# Patient Record
Sex: Female | Born: 1935 | Race: Black or African American | Hispanic: No | Smoking: Never smoker
Health system: Southern US, Community
[De-identification: ages and names within clinical notes are randomized; demographics above are authoritative.]

## PROBLEM LIST (undated history)

## (undated) DIAGNOSIS — I10 Essential (primary) hypertension: Secondary | ICD-10-CM

## (undated) DIAGNOSIS — N289 Disorder of kidney and ureter, unspecified: Secondary | ICD-10-CM

## (undated) HISTORY — PX: ABDOMINAL HYSTERECTOMY: SHX81

## (undated) HISTORY — PX: CHOLECYSTECTOMY: SHX55

---

## 2007-03-28 ENCOUNTER — Ambulatory Visit: Payer: Self-pay | Admitting: Otolaryngology

## 2007-03-31 ENCOUNTER — Ambulatory Visit: Payer: Self-pay | Admitting: Family Medicine

## 2007-04-05 ENCOUNTER — Ambulatory Visit: Payer: Self-pay | Admitting: Otolaryngology

## 2009-05-15 ENCOUNTER — Ambulatory Visit: Payer: Self-pay | Admitting: Family Medicine

## 2011-11-05 ENCOUNTER — Inpatient Hospital Stay: Payer: Self-pay | Admitting: Internal Medicine

## 2011-11-05 LAB — COMPREHENSIVE METABOLIC PANEL
Alkaline Phosphatase: 111 U/L (ref 50–136)
Calcium, Total: 9 mg/dL (ref 8.5–10.1)
Co2: 26 mmol/L (ref 21–32)
EGFR (African American): 33 — ABNORMAL LOW
EGFR (Non-African Amer.): 29 — ABNORMAL LOW
Osmolality: 287 (ref 275–301)
SGPT (ALT): 29 U/L

## 2011-11-05 LAB — URINALYSIS, COMPLETE
Bilirubin,UR: NEGATIVE
Glucose,UR: NEGATIVE mg/dL (ref 0–75)
Ketone: NEGATIVE
Leukocyte Esterase: NEGATIVE
RBC,UR: 2 /HPF (ref 0–5)
Squamous Epithelial: 5

## 2011-11-05 LAB — CBC
HCT: 44.3 % (ref 35.0–47.0)
Platelet: 285 10*3/uL (ref 150–440)
RDW: 14.2 % (ref 11.5–14.5)
WBC: 7.5 10*3/uL (ref 3.6–11.0)

## 2011-11-05 LAB — CK TOTAL AND CKMB (NOT AT ARMC): CK, Total: 134 U/L (ref 21–215)

## 2011-11-05 LAB — MAGNESIUM: Magnesium: 1.7 mg/dL — ABNORMAL LOW

## 2011-11-05 LAB — TSH: Thyroid Stimulating Horm: 1.38 u[IU]/mL

## 2011-11-05 LAB — PHOSPHORUS: Phosphorus: 4.2 mg/dL (ref 2.5–4.9)

## 2011-11-06 LAB — TROPONIN I
Troponin-I: 0.37 ng/mL — ABNORMAL HIGH
Troponin-I: 0.34 ng/mL — ABNORMAL HIGH

## 2011-11-06 LAB — BASIC METABOLIC PANEL WITH GFR
Anion Gap: 7
BUN: 32 mg/dL — ABNORMAL HIGH
Calcium, Total: 8.4 mg/dL — ABNORMAL LOW
Chloride: 107 mmol/L
Co2: 24 mmol/L
Creatinine: 1.48 mg/dL — ABNORMAL HIGH
EGFR (African American): 38 — ABNORMAL LOW
EGFR (Non-African Amer.): 33 — ABNORMAL LOW
Glucose: 93 mg/dL
Osmolality: 282
Potassium: 4.4 mmol/L
Sodium: 138 mmol/L

## 2011-11-06 LAB — CBC WITH DIFFERENTIAL/PLATELET
Basophil #: 0 x10 3/mm 3
Basophil %: 0.5 %
Eosinophil #: 0 x10 3/mm 3
Eosinophil %: 0.8 %
HCT: 37.7 %
HGB: 13 g/dL
Lymphocyte %: 19.6 %
Lymphs Abs: 1.2 x10 3/mm 3
MCH: 29.9 pg
MCHC: 34.6 g/dL
MCV: 87 fL
Monocyte #: 0.5 "x10 3/mm "
Monocyte %: 7.4 %
Neutrophil #: 4.4 x10 3/mm 3
Neutrophil %: 71.7 %
Platelet: 235 x10 3/mm 3
RBC: 4.35 X10 6/mm 3
RDW: 14.3 %
WBC: 6.2 x10 3/mm 3

## 2011-11-06 LAB — LIPID PANEL
Cholesterol: 231 mg/dL — ABNORMAL HIGH
HDL Cholesterol: 61 mg/dL — ABNORMAL HIGH
Ldl Cholesterol, Calc: 156 mg/dL — ABNORMAL HIGH
Triglycerides: 70 mg/dL
VLDL Cholesterol, Calc: 14 mg/dL

## 2011-11-06 LAB — CK TOTAL AND CKMB (NOT AT ARMC)
CK, Total: 124 U/L
CK, Total: 155 U/L
CK-MB: 1.4 ng/mL
CK-MB: 1.6 ng/mL

## 2011-11-06 LAB — MAGNESIUM: Magnesium: 2.3 mg/dL

## 2011-11-06 LAB — HEMOGLOBIN A1C: Hemoglobin A1C: 5.7 %

## 2011-11-07 LAB — BASIC METABOLIC PANEL
Anion Gap: 10 (ref 7–16)
BUN: 31 mg/dL — ABNORMAL HIGH (ref 7–18)
Calcium, Total: 9 mg/dL (ref 8.5–10.1)
Co2: 24 mmol/L (ref 21–32)
EGFR (African American): 36 — ABNORMAL LOW
EGFR (Non-African Amer.): 31 — ABNORMAL LOW
Glucose: 104 mg/dL — ABNORMAL HIGH (ref 65–99)
Osmolality: 288 (ref 275–301)
Potassium: 4.2 mmol/L (ref 3.5–5.1)

## 2012-01-25 ENCOUNTER — Ambulatory Visit: Payer: Self-pay | Admitting: Family Medicine

## 2012-02-02 ENCOUNTER — Ambulatory Visit: Payer: Self-pay | Admitting: Family Medicine

## 2014-07-31 NOTE — Discharge Summary (Signed)
PATIENT NAME:  Michelle Carney, Michelle Carney MR#:  [id number] DATE OF BIRTH:  11/07/1929  DATE OF ADMISSION:  11/05/2011 DATE OF DISCHARGE:  11/07/2011  DISCHARGE DIAGNOSES:  1. Syncope secondary to dehydration. 2. Acute on chronic renal failure. 3. Dementia. 4. Urinary tract infection. 5. Hypertension. 6. Hyperlipidemia.   DISCHARGE MEDICATIONS: (New medicines) 1. Amlodipine 5 mg is added for her blood pressure.  2. For urinary tract infection, Cipro 500 mg b.i.d. for five days.  3. Xanax 0.5 mg ,1 to 2 times a day was prescribed according to family request for anxiety.  4. Aricept 5 mg daily.   Home medications which are not changed:  1. Pravastatin 40 mg daily.  2. Ranitidine 150 mg daily.   NOTE: The patient was on losartan, which was stopped, because of acute renal insufficiency.   CONSULTATIONS: Physical Therapy consult.    LABORATORY, DIAGNOSTIC AND RADIOLOGICAL DATA: Ventricular EKG: Sinus rhythm with some PVCs, 81 beats per minute. TSH 1.38. Magnesium slightly low at 1.7. Urinalysis showed yellow cloudy urine with 2+ bacteria. Troponin was slightly elevated at 0.36, 0.37, with normal CK and CPK-MB. A CT of the head was done because of syncope which showed atrophy with chronic vessel ischemic changes, left posterior parietal or bilateral occipital small infarct, but MRA of the brain showed no acute infarct, no hemorrhage, no pathological fluid collection. The patient's findings on the CT: Sequela of prior insult and also showed  cerebral atrophy. The patient's echocardiogram showed ejection fraction more than 55% with normal LV function. The patient's BUN and creatinine on admission were slightly low at BUN of 30, creatinine 1.67. At the time of discharge, BUN 31, creatinine better at 1.57, and GFR 36. Hemoglobin A1c 5.7 at admission but improved to 2.3. Troponin 0.34 but no EKG changes. Echocardiogram was normal. No chest pain.   HOSPITAL COURSE: The patient is an 81 year old female with  history of hypertension, hyperlipidemia, and dementia, brought in because the patient had syncope. Look at the History and Physical for full details.   1. Syncope: The patient did not have any seizure activity. Blood pressure was 176/120 when can she came. She was admitted for possible cerebrovascular accident. CT head showed possible stroke, but MRI of the brain did not show any acute changes. The patient's carotid ultrasound also did not show any acute hemodynamically significant stenosis. Echocardiogram: Normal systolic function. The patient initially was started on Aggrenox, but it was not continued because stroke was ruled out.  2. Acute on chronic renal insufficiency: The patient did have some renal insufficiency to begin with, and BUN was 13, creatinine 1.67 on admission; so she did receive fluids, and at the time of discharge creatinine was 1.57 and BUN 31. The patient had no trouble urinating, and the family mentioned that she did have some chronic renal insufficiency; so I stopped the losartan and continued on Norvasc and she is to follow with her primary doctor, Dr. Delight Stare.  3. Hypertension: Blood pressure was 153/84, and her losartan was stopped and she was started on beta blocker, but blood pressure dropped to 90/44 yesterday morning and heart rate 61; so I decided to start her on Norvasc and blood pressure returned back to 115/61 at the time of discharge.  4. Dementia and forgetfulness: The patient did get quite agitated on the 26th night and did receive some Geodon and was not oriented. According to the family, she has been getting forgetful lately; so they requested some Xanax andAricept prescriptions, so we filled  that.   5. Hyperlipidemia: Continued on pravastatin. LDL showed 156, and HDL 61, and cholesterol 231. There was some noncompliance issues so I did not increase the dose of statins.  6. Urinary tract infection: Also she has a urinary tract infection. Urine cultures are not  resulted yet. I will discharge her with Cipro.  7. Disposition: The patient was seen by Physical Therapy and discharged home with home assistance on [place].  according to PT recommendation  I discussed the plan with the patient's son and daughter-in-law multiple times.       TIME SPENT ON DISCHARGE PREPARATION: More than 30 minutes.  ____________________________ Epifanio Lesches, MD sk:cbb D: 11/08/2011 08:02:58 ET T: 11/08/2011 16:04:16 ET JOB#: [id number]  cc: Epifanio Lesches, MD, <Dictator> Marguerita Merles, MD Epifanio Lesches MD ELECTRONICALLY SIGNED 11/18/2011 14:07

## 2014-07-31 NOTE — H&P (Signed)
PATIENT NAME:  Michelle Carney, Michelle Carney MR#:  [id number] DATE OF BIRTH:  [birth year]  DATE OF ADMISSION:  11/05/2011  REFERRING PHYSICIAN: Dr. Michel Santee   PRIMARY CARE PHYSICIAN: Delight Stare, MD   CHIEF COMPLAINT: Syncopal episode.   HISTORY OF PRESENT ILLNESS: The patient is an 81 year old African American female with history of hypertension and hyperlipidemia who was brought in by family. The patient is not a particularly good historian. Per son and her paperwork whom I consulted to get information, apparently the patient was cooking breakfast this morning, felt weak and went to sit on the chair. After she sat she had about a 10 second period of flailing her arms and arching her back backwards. The patient passed out. No fall. The patient upon waking up was not postictal and had no confusion and was back to her baseline. The patient did have a bout of vomiting, however, was smallish per son. No shaking of the lower extremities. There was no incontinence. There was no tongue biting. The patient has had no history of syncope in the past. On arrival here she was noted to have creatinine 1.67. It is unclear if it's new or chronic. Furthermore, she had a positive troponin of 0.36 and a CT suggesting a possible new CVA. The patient was requested to be admitted for further evaluation and management by the hospitalist service.   PAST MEDICAL HISTORY:  1. Hypertension. 2. Hyperlipidemia. 3. Per her son forgetfulness without any diagnosis of dementia.   MEDICATIONS: 1. Losartan 50 mg daily. 2. Pravastatin 40 mg qd. 3. Ranitidine 150 mg p.o. daily p.r.n.   PAST SURGICAL HISTORY:  1. Hysterectomy. 2. Gallbladder surgery.   FAMILY HISTORY: Hypertension in mom, gallbladder disease.   SOCIAL HISTORY: No tobacco, alcohol, or drug use. Her son lives with her.   ALLERGIES: No known drug allergies.   REVIEW OF SYSTEMS: CONSTITUTIONAL: No fever. Fatigue for the past one month. Generalized weakness. No focal weakness.  Positive for weight loss. EYES: The patient states she has dizziness in her eyes. No redness. ENT: No tinnitus or hearing loss. The patient has had no sense of taste or smell for the past year or so. RESPIRATORY: No cough, wheezing, shortness of breath. No COPD. CARDIOVASCULAR: No chest pain. No orthopnea. No history of arrhythmia. No dyspnea on exertion. No palpitations. No history of syncope in the past. History of high blood pressure. GI: No nausea or vomiting. Currently had episode of nausea and vomiting this morning. Per her family she has had nausea and vomiting off and on for the past month, up to a couple of times in the past month or so. No abdominal pain. No melena or bloody stools. No bright red blood per rectum. GU: Denies dysuria. HEME/LYMPH: Denies anemia or easy bruising. SKIN: No new rashes. MUSCULOSKELETAL: Denies arthritis. NEUROLOGICAL: Possible dementia, no diagnosis however. PSYCHIATRIC: Denies anxiety or insomnia.   PHYSICAL EXAMINATION:   VITAL SIGNS: Temperature on arrival 99, pulse on arrival 88, respiratory rate 18, blood pressure 108/59, last one was 176/120, pulse oximetry 97% on room air.   GENERAL: The patient is a thin African American female laying in bed in no obvious distress talking in full sentences.   HEENT: Normocephalic, atraumatic. Pupils are equal, round, and reactive. Extraocular muscles intact. Anicteric sclerae. Moist mucous membranes. Poor dentition.    NECK: Supple. No thyroid tenderness. No JVD.   CARDIOVASCULAR: S1, S2. Possible systolic murmur around the aortic region.   LUNGS: Clear to auscultation without wheezing or rhonchi.  ABDOMEN: Soft, nontender, nondistended. Positive bowel sounds in all quadrants.   EXTREMITIES: No significant lower extremity edema.   NEUROLOGIC: Cranial nerves appear to be grossly intact II to XII. Strength is 5 out of 5 in all extremities. Sensation intact to light touch. Rapid alternating movements are intact.  Finger-to-nose intact.   PSYCHIATRIC: The patient is awake, alert, oriented x2, cooperative, pleasant.   LABORATORY, DIAGNOSTIC, AND RADIOLOGICAL DATA: Creatinine 1.67, BUN 30, sodium 141, potassium 4.6, CO2 26, GFR 33. Magnesium 1.7. LFTs within normal limits. Troponin 0.36. CK total 134. CK-MB 1.1. TSH 1.38. CBC within normal limits. Urinalysis not suggestive of infection.   EKG sinus rhythm with PVCs, biatrial enlargement, incomplete right bundle, no acute ST elevations or depressions, Q waves in III.  CAT scan of the head without contrast showing atrophy with chronic small vessel ischemic disease. Left posterior parietal to parietal occipital small infarct.   ASSESSMENT AND PLAN: We have an 81 year old female with history of hypertension, hyperlipidemia, and possible undiagnosed dementia with a syncopal episode lasting about 10 seconds without any postictal state, possible CVA on CAT scan which likely might be new onset. At this point we will admit the patient to telemetry. Would start CVA pathway allowing permissive hypertension and checking MRI of the brain. Neurologically the patient appears to be intact. Would also check an echocardiogram and a carotid ultrasound to look for any significant blockages. Would add p.r.n. labetalol if systolic pressures are significantly elevated. I would start the patient on Aggrenox and resume her statin. I will also obtain a PT consult. It is unclear if the patient had a seizure but it's unlikely given the short duration without any postictal state, however I would go ahead and check an EEG. Would also do frequent neuro checks and check for any arrhythmias by placing her on the monitor. The patient does have renal failure but I do not know if this is acute or chronic. I would hold her losartan. I would start her on gentle IV fluids as well. I would monitor her kidney function and consider doing an ultrasound of the kidneys if it does not improve. I would check a  cholesterol panel as well for her hyperlipidemia. The patient does have positive troponin, however, has no chest pains or complaints of chest pain. There is no acute ST elevations or depressions on EKG either. I would cycle troponins. Aspirin has already been given. Continue her lipids. I would start her on nitroglycerin p.r.n. just in case she will have any further chest pains and given echocardiogram. It is also possible that elevated troponin is from decreased renal disease. It is also possible from CVA if patient has it on the MRI. Her magnesium will be repleted as well. I would also start her on some Zofran p.r.n. for her nausea.   TOTAL TIME SPENT: 50 minutes.   CODE STATUS: The patient is FULL CODE.   ____________________________ Vivien Presto, MD sa:drc D: 11/05/2011 19:22:42 ET T: 11/06/2011 06:40:39 ET JOB#: [id number]  cc: Vivien Presto, MD, <Dictator> Marguerita Merles, MD Vivien Presto MD ELECTRONICALLY SIGNED 11/24/2011 15:16

## 2015-01-09 ENCOUNTER — Encounter: Payer: Self-pay | Admitting: Emergency Medicine

## 2015-01-09 ENCOUNTER — Emergency Department
Admission: EM | Admit: 2015-01-09 | Discharge: 2015-01-09 | Discharge status: Against Medical Advice | Payer: Medicare HMO | Attending: Emergency Medicine | Admitting: Emergency Medicine

## 2015-01-09 DIAGNOSIS — R1110 Vomiting, unspecified: Secondary | ICD-10-CM | POA: Insufficient documentation

## 2015-01-09 DIAGNOSIS — I10 Essential (primary) hypertension: Secondary | ICD-10-CM | POA: Diagnosis not present

## 2015-01-09 HISTORY — DX: Essential (primary) hypertension: I10

## 2015-01-09 HISTORY — DX: Disorder of kidney and ureter, unspecified: N28.9

## 2015-01-09 LAB — CBC
HEMATOCRIT: 40.4 % (ref 35.0–47.0)
Hemoglobin: 13.5 g/dL (ref 12.0–16.0)
MCH: 29 pg (ref 26.0–34.0)
MCHC: 33.5 g/dL (ref 32.0–36.0)
MCV: 86.5 fL (ref 80.0–100.0)
Platelets: 281 10*3/uL (ref 150–440)
RBC: 4.67 MIL/uL (ref 3.80–5.20)
RDW: 13.4 % (ref 11.5–14.5)
WBC: 11.1 10*3/uL — ABNORMAL HIGH (ref 3.6–11.0)

## 2015-01-09 LAB — URINALYSIS COMPLETE WITH MICROSCOPIC (ARMC ONLY)
Bilirubin Urine: NEGATIVE
GLUCOSE, UA: NEGATIVE mg/dL
Ketones, ur: NEGATIVE mg/dL
NITRITE: NEGATIVE
PROTEIN: 100 mg/dL — AB
SPECIFIC GRAVITY, URINE: 1.02 (ref 1.005–1.030)
pH: 5 (ref 5.0–8.0)

## 2015-01-09 LAB — BASIC METABOLIC PANEL
Anion gap: 6 (ref 5–15)
BUN: 22 mg/dL — AB (ref 6–20)
CALCIUM: 9.1 mg/dL (ref 8.9–10.3)
CO2: 25 mmol/L (ref 22–32)
Chloride: 109 mmol/L (ref 101–111)
Creatinine, Ser: 1.56 mg/dL — ABNORMAL HIGH (ref 0.44–1.00)
GFR calc Af Amer: 34 mL/min — ABNORMAL LOW (ref >60)
GFR, EST NON AFRICAN AMERICAN: 30 mL/min — AB (ref >60)
GLUCOSE: 176 mg/dL — AB (ref 65–99)
POTASSIUM: 3.7 mmol/L (ref 3.5–5.1)
Sodium: 140 mmol/L (ref 135–145)

## 2015-01-09 NOTE — ED Notes (Signed)
Patient called for treatment area. No answer x 2.

## 2015-01-09 NOTE — ED Notes (Signed)
Pt from home with son who states she has been vomiting today (small amount) and that she has seemed more confused for the last week. Son suspects UTI, as this mimics her symptoms from last time she had one. Pt denies pain or burning upon urination. Son states she has had urgency and only produces a few drops sometimes.

## 2015-01-10 ENCOUNTER — Telehealth: Payer: Self-pay | Admitting: Emergency Medicine

## 2015-01-10 NOTE — ED Notes (Signed)
Called patient due to lwot to inquire about condition and follow up plans. Number on chart would not go through.

## 2016-07-01 ENCOUNTER — Emergency Department: Payer: Medicare HMO

## 2016-07-01 ENCOUNTER — Observation Stay
Admission: EM | Admit: 2016-07-01 | Discharge: 2016-07-02 | Disposition: A | Discharge status: Home / Self Care | Payer: Medicare HMO | Attending: Internal Medicine | Admitting: Internal Medicine

## 2016-07-01 ENCOUNTER — Encounter: Payer: Self-pay | Admitting: Emergency Medicine

## 2016-07-01 DIAGNOSIS — W19XXXA Unspecified fall, initial encounter: Secondary | ICD-10-CM | POA: Diagnosis not present

## 2016-07-01 DIAGNOSIS — N179 Acute kidney failure, unspecified: Secondary | ICD-10-CM | POA: Diagnosis not present

## 2016-07-01 DIAGNOSIS — R7989 Other specified abnormal findings of blood chemistry: Secondary | ICD-10-CM

## 2016-07-01 DIAGNOSIS — E785 Hyperlipidemia, unspecified: Secondary | ICD-10-CM | POA: Insufficient documentation

## 2016-07-01 DIAGNOSIS — M25551 Pain in right hip: Secondary | ICD-10-CM | POA: Insufficient documentation

## 2016-07-01 DIAGNOSIS — M25559 Pain in unspecified hip: Secondary | ICD-10-CM

## 2016-07-01 DIAGNOSIS — I110 Hypertensive heart disease with heart failure: Secondary | ICD-10-CM | POA: Diagnosis not present

## 2016-07-01 DIAGNOSIS — R748 Abnormal levels of other serum enzymes: Secondary | ICD-10-CM | POA: Diagnosis present

## 2016-07-01 DIAGNOSIS — E876 Hypokalemia: Secondary | ICD-10-CM | POA: Insufficient documentation

## 2016-07-01 DIAGNOSIS — N289 Disorder of kidney and ureter, unspecified: Secondary | ICD-10-CM

## 2016-07-01 DIAGNOSIS — I509 Heart failure, unspecified: Secondary | ICD-10-CM | POA: Diagnosis not present

## 2016-07-01 DIAGNOSIS — F17290 Nicotine dependence, other tobacco product, uncomplicated: Secondary | ICD-10-CM | POA: Diagnosis not present

## 2016-07-01 DIAGNOSIS — R55 Syncope and collapse: Secondary | ICD-10-CM | POA: Diagnosis not present

## 2016-07-01 DIAGNOSIS — Z79899 Other long term (current) drug therapy: Secondary | ICD-10-CM | POA: Diagnosis not present

## 2016-07-01 DIAGNOSIS — F0390 Unspecified dementia without behavioral disturbance: Secondary | ICD-10-CM | POA: Insufficient documentation

## 2016-07-01 DIAGNOSIS — R778 Other specified abnormalities of plasma proteins: Secondary | ICD-10-CM

## 2016-07-01 DIAGNOSIS — I248 Other forms of acute ischemic heart disease: Secondary | ICD-10-CM | POA: Diagnosis not present

## 2016-07-01 DIAGNOSIS — R600 Localized edema: Secondary | ICD-10-CM

## 2016-07-01 LAB — CBC
HCT: 36 % (ref 35.0–47.0)
Hemoglobin: 12.2 g/dL (ref 12.0–16.0)
MCH: 29.7 pg (ref 26.0–34.0)
MCHC: 33.9 g/dL (ref 32.0–36.0)
MCV: 87.6 fL (ref 80.0–100.0)
Platelets: 247 10*3/uL (ref 150–440)
RBC: 4.12 MIL/uL (ref 3.80–5.20)
RDW: 14.4 % (ref 11.5–14.5)
WBC: 6.8 10*3/uL (ref 3.6–11.0)

## 2016-07-01 LAB — URINALYSIS, COMPLETE (UACMP) WITH MICROSCOPIC
Bilirubin Urine: NEGATIVE
GLUCOSE, UA: NEGATIVE mg/dL
Ketones, ur: NEGATIVE mg/dL
Leukocytes, UA: NEGATIVE
NITRITE: NEGATIVE
PH: 5 (ref 5.0–8.0)
PROTEIN: 100 mg/dL — AB
Specific Gravity, Urine: 1.023 (ref 1.005–1.030)

## 2016-07-01 LAB — BRAIN NATRIURETIC PEPTIDE: B Natriuretic Peptide: 98 pg/mL (ref 0.0–100.0)

## 2016-07-01 LAB — BASIC METABOLIC PANEL
Anion gap: 7 (ref 5–15)
BUN: 24 mg/dL — AB (ref 6–20)
CALCIUM: 8.9 mg/dL (ref 8.9–10.3)
CO2: 24 mmol/L (ref 22–32)
Chloride: 107 mmol/L (ref 101–111)
Creatinine, Ser: 1.83 mg/dL — ABNORMAL HIGH (ref 0.44–1.00)
GFR calc Af Amer: 28 mL/min — ABNORMAL LOW (ref >60)
GFR, EST NON AFRICAN AMERICAN: 24 mL/min — AB (ref >60)
GLUCOSE: 119 mg/dL — AB (ref 65–99)
Potassium: 3.3 mmol/L — ABNORMAL LOW (ref 3.5–5.1)
Sodium: 138 mmol/L (ref 135–145)

## 2016-07-01 LAB — TROPONIN I: Troponin I: 0.13 ng/mL (ref <0.03)

## 2016-07-01 MED ORDER — SODIUM CHLORIDE 0.9 % IV BOLUS (SEPSIS)
500.0000 mL | Freq: Once | INTRAVENOUS | Status: AC
  Administered 2016-07-02: 500 mL via INTRAVENOUS

## 2016-07-01 MED ORDER — ACETAMINOPHEN 325 MG PO TABS
650.0000 mg | ORAL_TABLET | Freq: Once | ORAL | Status: AC
  Administered 2016-07-01: 650 mg via ORAL
  Filled 2016-07-01: qty 2

## 2016-07-01 MED ORDER — LORAZEPAM 2 MG/ML IJ SOLN
0.5000 mg | Freq: Once | INTRAMUSCULAR | Status: AC
  Administered 2016-07-01: 0.5 mg via INTRAVENOUS

## 2016-07-01 MED ORDER — ASPIRIN 81 MG PO CHEW
324.0000 mg | CHEWABLE_TABLET | Freq: Once | ORAL | Status: AC
  Administered 2016-07-01: 324 mg via ORAL
  Filled 2016-07-01: qty 4

## 2016-07-01 MED ORDER — LORAZEPAM 2 MG/ML IJ SOLN
INTRAMUSCULAR | Status: AC
  Administered 2016-07-01: 0.5 mg via INTRAVENOUS
  Filled 2016-07-01: qty 1

## 2016-07-01 NOTE — ED Notes (Signed)
Lab called with troponin of 0.13, Dr. Mariea Clonts made aware.
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
## 2016-07-01 NOTE — ED Notes (Signed)
Pt taken to room 25 via w/c; placed in hosp gown & on card monitor; report given to Kure Beach, RN

## 2016-07-01 NOTE — ED Notes (Signed)
This RN at bedside to speak w/ family about admission.  Pts son had reported that he wanted to take patient home. Said that pt would not do well w/o son at bedside, and that he would not be able to spend night.  Explained to pt and family that pt had critical troponin level, how that would effect body and detriments if pt were to leave.  Son expressed understanding, agreed to have pt stay until pts XRs returned.  MD Mariea Clonts informed.

## 2016-07-01 NOTE — ED Notes (Addendum)
Pts family reports to pt fell x 1 week ago, been having R hip pain since.  State that pt seen Friday by PCP and ordered to XR this week.  Pt reports feeling more "tired' since fall, c/o decr in activity and incr in bilateral leg swelling.  Pt denies SOB, CP, n/v/d or fevers

## 2016-07-01 NOTE — ED Triage Notes (Addendum)
Pt to triage in wheelchair due to right hip pain. Pts family reports pt had a mechanical fall x1 week ago, has c/o of right hip and right leg pain since. Family also reports pt has been weak and more altered from baseline x2 weeks. Pt denies all symptoms, pt sts "I am fine, I don't hurt." Pt has hx of dementia. Pt seen at PCP on Friday, advised to get xray at ED, pt unable to come until today.

## 2016-07-01 NOTE — ED Provider Notes (Signed)
[facility] Emergency Department Provider Note  ____________________________________________  Time seen: Approximately 10:04 PM  I have reviewed the triage vital signs and the nursing notes.   HISTORY  Chief Complaint Hip Pain    HPI Michelle Carney is a 83 y.o. female a history of dementia, HTN, presenting for right hip pain. The patient was in the bathroom several days ago when the family heard a thud. The etiology of the patient's fall is unclear, but there was no apparent loss of consciousness. Since then, the patient has had right hip pain, has been able to relate, and has been successfully treated with Tylenol. The patient denies any chest pain, shortness breath, palpitations, lightheadedness or syncope, but she is demented so her history is limited.   Past Medical History:  Diagnosis Date  . Hypertension   . Renal disorder     There are no active problems to display for this patient.   Past Surgical History:  Procedure Laterality Date  . ABDOMINAL HYSTERECTOMY    . CHOLECYSTECTOMY        Allergies Patient has no known allergies.  History reviewed. No pertinent family history.  Social History Social History  Substance Use Topics  . Smoking status: Never Smoker  . Smokeless tobacco: Current User    Types: Snuff  . Alcohol use No    Review of Systems Constitutional: No fever/chills.Positive fall. No clear syncope. Eyes: No visual changes. ENT: No sore throat. No congestion or rhinorrhea. Cardiovascular: Denies chest pain. Denies palpitations. Respiratory: Denies shortness of breath.  No cough. Gastrointestinal: No abdominal pain.  No nausea, no vomiting.  No diarrhea.  No constipation. Genitourinary: Negative for dysuria. Musculoskeletal: Negative for back pain. No neck pain. Positive right hip pain. Positive new right greater than left bilateral lotion for swelling. Skin: Negative for rash. Neurological: Negative for headaches.  No focal numbness, tingling or weakness.   10-point ROS otherwise negative.  ____________________________________________   PHYSICAL EXAM:  VITAL SIGNS: ED Triage Vitals  Enc Vitals Group     BP 07/01/16 2001 140/61     Pulse Rate 07/01/16 2001 76     Resp 07/01/16 2001 16     Temp 07/01/16 2001 98 F (36.7 C)     Temp Source 07/01/16 2001 Oral     SpO2 07/01/16 2001 98 %     Weight 07/01/16 2001 152 lb (68.9 kg)     Height 07/01/16 2001 5\' 6"  (1.676 m)     Head Circumference --      Peak Flow --      Pain Score 07/01/16 2122 0     Pain Loc --      Pain Edu? --      Excl. in [place]? --     Constitutional:Patient is alert and answers some questions. She is demented. She is able to follow commands. Eyes: Conjunctivae are normal.  EOMI. PERRLA. No scleral icterus. Head: Atraumatic. Nose: No congestion/rhinnorhea. Mouth/Throat: Mucous membranes are moist.  Neck: No stridor.  Supple.  No midline C-spine tenderness to palpation, step-offs or deformities. Full range of motion without pain. No JVD. Cardiovascular: Normal rate, regular rhythm. No murmurs, rubs or gallops.  Respiratory: Normal respiratory effort.  No accessory muscle use or retractions. Lungs CTAB.  No wheezes, rales or ronchi. Gastrointestinal: Soft, nontender and nondistended.  No guarding or rebound.  No peritoneal signs. Musculoskeletal: Positive tenderness to palpation of the right greater trochanter and the proximal lateral thigh with some pain with range  of motion. Full range of motion of the right knee, ankle, left hip, knee, and ankle without pain. Normal DP and PT pulses bilaterally. Normal sensation to light touch bilaterally. Right greater than left lower extremity swelling without palpable cords or calf tenderness.. Neurologic:  Alert.  Speech is clear.  Face and smile are symmetric.  EOMI.  Moves all extremities well. Skin:  Skin is warm, dry and intact. No rash noted. Psychiatric: Mood and affect are normal.  Speech and behavior are normal.  Normal judgement.  ____________________________________________   LABS (all labs ordered are listed, but only abnormal results are displayed)  Labs Reviewed  BASIC METABOLIC PANEL - Abnormal; Notable for the following:       Result Value   Potassium 3.3 (*)    Glucose, Bld 119 (*)    BUN 24 (*)    Creatinine, Ser 1.83 (*)    GFR calc non Af Amer 24 (*)    GFR calc Af Amer 28 (*)    All other components within normal limits  URINALYSIS, COMPLETE (UACMP) WITH MICROSCOPIC - Abnormal; Notable for the following:    Color, Urine YELLOW (*)    APPearance HAZY (*)    Hgb urine dipstick SMALL (*)    Protein, ur 100 (*)    Bacteria, UA RARE (*)    Squamous Epithelial / LPF 6-30 (*)    All other components within normal limits  TROPONIN I - Abnormal; Notable for the following:    Troponin I 0.13 (*)    All other components within normal limits  CBC  BRAIN NATRIURETIC PEPTIDE  CBG MONITORING, ED   ____________________________________________  EKG  ED ECG REPORT I, Eula Listen, the attending physician, personally viewed and interpreted this ECG.   Date: 07/01/2016  EKG Time: 2006  Rate: 73  Rhythm: normal sinus rhythm  Axis: leftward  Intervals:none  ST&T Change: RBBB, no STEMI  ____________________________________________  RADIOLOGY  Dg Chest 2 View  Result Date: 07/01/2016 CLINICAL DATA:  Weaker and more altered EXAM: CHEST  2 VIEW COMPARISON:  05/15/2009 FINDINGS: Elevation of the left diaphragm as before. Minimal bibasilar atelectasis or scarring. No acute consolidation or effusion. Borderline cardiomegaly. Atherosclerosis. No pneumothorax. IMPRESSION: Slightly elevated diaphragms with bibasilar atelectasis or scarring. No acute infiltrates. Electronically Signed   By: Donavan Foil M.D.   On: 07/01/2016 23:09    ____________________________________________   PROCEDURES  Procedure(s) performed:  None  Procedures  Critical Care performed: No ____________________________________________   INITIAL IMPRESSION / ASSESSMENT AND PLAN / ED COURSE  Pertinent labs & imaging results that were available during my care of the patient were reviewed by me and considered in my medical decision making (see chart for details).  83 y.o. female presenting for right hip pain after fall, but from triage has a troponin which is positive. The patient does not report any chest pain, but she does have new onset right greater than left lower extremity swelling. We'll get a BNP to evaluate for congestive heart failure, I'll treat the patient with aspirin for her elevated troponin, but there is no indication for heparinization at this time. I do not think the patient has a PE, but will get an ultrasound of the lower extremities.  If the patient's hip x-ray is negative, she will undergo CT for complete evaluation.  ----------------------------------------- 11:29 PM on 07/01/2016 -----------------------------------------  Pt has remained hemodynamic stable. She did have some anxiety when x-ray came to get her for her hip x-ray, so she has  been given Ativan. She's been signed out to Dr. Hinda Kehr will make the final disposition plan for this patient.  ____________________________________________  FINAL CLINICAL IMPRESSION(S) / ED DIAGNOSES  Final diagnoses:  Right hip pain  Fall, initial encounter  Elevated troponin  Lower extremity edema    Clinical Course as of Jul 01 2328  Wed Jul 01, 2016  2313 Assuming care from Dr. Mariea Clonts.  In short, Michelle Carney is a 83 y.o. female with a chief complaint of hip pain.  Refer to the original H&P for additional details.  The current plan of care is to follow up x-rays, order hip CT if radiographs negative, and touch base with hospitalist for admission (hospitalist is already aware of the patient).   [CF]    Clinical Course User Index [CF] Hinda Kehr, MD       NEW MEDICATIONS STARTED DURING THIS VISIT:  New Prescriptions   No medications on file      Eula Listen, MD 07/01/16 2330

## 2016-07-01 NOTE — ED Notes (Signed)
Patient transported to X-ray 

## 2016-07-02 ENCOUNTER — Observation Stay
Admit: 2016-07-02 | Discharge: 2016-07-02 | Disposition: A | Discharge status: Home / Self Care | Payer: Medicare HMO | Attending: Cardiovascular Disease | Admitting: Cardiovascular Disease

## 2016-07-02 ENCOUNTER — Emergency Department: Payer: Medicare HMO

## 2016-07-02 ENCOUNTER — Encounter: Payer: Self-pay | Admitting: Internal Medicine

## 2016-07-02 ENCOUNTER — Observation Stay: Payer: Medicare HMO

## 2016-07-02 DIAGNOSIS — R55 Syncope and collapse: Secondary | ICD-10-CM | POA: Diagnosis present

## 2016-07-02 DIAGNOSIS — I248 Other forms of acute ischemic heart disease: Secondary | ICD-10-CM | POA: Diagnosis not present

## 2016-07-02 LAB — BASIC METABOLIC PANEL
Anion gap: 5 (ref 5–15)
BUN: 20 mg/dL (ref 6–20)
CHLORIDE: 110 mmol/L (ref 101–111)
CO2: 24 mmol/L (ref 22–32)
CREATININE: 1.41 mg/dL — AB (ref 0.44–1.00)
Calcium: 8.7 mg/dL — ABNORMAL LOW (ref 8.9–10.3)
GFR calc Af Amer: 38 mL/min — ABNORMAL LOW (ref >60)
GFR, EST NON AFRICAN AMERICAN: 33 mL/min — AB (ref >60)
Glucose, Bld: 102 mg/dL — ABNORMAL HIGH (ref 65–99)
POTASSIUM: 3.7 mmol/L (ref 3.5–5.1)
SODIUM: 139 mmol/L (ref 135–145)

## 2016-07-02 LAB — CBC
HCT: 35.7 % (ref 35.0–47.0)
Hemoglobin: 11.9 g/dL — ABNORMAL LOW (ref 12.0–16.0)
MCH: 29.5 pg (ref 26.0–34.0)
MCHC: 33.4 g/dL (ref 32.0–36.0)
MCV: 88.4 fL (ref 80.0–100.0)
Platelets: 187 10*3/uL (ref 150–440)
RBC: 4.04 MIL/uL (ref 3.80–5.20)
RDW: 14.4 % (ref 11.5–14.5)
WBC: 5.7 10*3/uL (ref 3.6–11.0)

## 2016-07-02 LAB — ECHOCARDIOGRAM COMPLETE
Height: 66 in
Weight: 2400 oz

## 2016-07-02 LAB — TROPONIN I
Troponin I: 0.12 ng/mL (ref <0.03)
Troponin I: 0.15 ng/mL (ref <0.03)
Troponin I: 0.15 ng/mL (ref <0.03)

## 2016-07-02 LAB — MAGNESIUM: Magnesium: 1.6 mg/dL — ABNORMAL LOW (ref 1.7–2.4)

## 2016-07-02 LAB — TSH: TSH: 1.347 u[IU]/mL (ref 0.350–4.500)

## 2016-07-02 LAB — CREATININE, SERUM
Creatinine, Ser: 1.45 mg/dL — ABNORMAL HIGH (ref 0.44–1.00)
GFR calc Af Amer: 37 mL/min — ABNORMAL LOW (ref >60)
GFR calc non Af Amer: 32 mL/min — ABNORMAL LOW (ref >60)

## 2016-07-02 MED ORDER — MORPHINE SULFATE (PF) 2 MG/ML IV SOLN
INTRAVENOUS | Status: AC
  Administered 2016-07-02: 2 mg via INTRAVENOUS
  Filled 2016-07-02: qty 1

## 2016-07-02 MED ORDER — ONDANSETRON HCL 4 MG/2ML IJ SOLN: 4.0000 mg | Freq: Four times a day (QID) | INTRAMUSCULAR | Status: DC | PRN

## 2016-07-02 MED ORDER — PRAVASTATIN SODIUM 20 MG PO TABS: 20.0000 mg | ORAL_TABLET | Freq: Every day | ORAL | Status: DC

## 2016-07-02 MED ORDER — ACETAMINOPHEN 650 MG RE SUPP: 650.0000 mg | Freq: Four times a day (QID) | RECTAL | Status: DC | PRN

## 2016-07-02 MED ORDER — DOCUSATE SODIUM 100 MG PO CAPS
100.0000 mg | ORAL_CAPSULE | Freq: Two times a day (BID) | ORAL | Status: DC
  Administered 2016-07-02: 100 mg via ORAL
  Filled 2016-07-02: qty 1

## 2016-07-02 MED ORDER — MORPHINE SULFATE (PF) 2 MG/ML IV SOLN
2.0000 mg | Freq: Once | INTRAVENOUS | Status: AC
  Administered 2016-07-02: 2 mg via INTRAVENOUS

## 2016-07-02 MED ORDER — RISPERIDONE 1 MG PO TABS
0.5000 mg | ORAL_TABLET | Freq: Two times a day (BID) | ORAL | Status: DC | PRN
Start: 2016-07-02 — End: 2016-07-02

## 2016-07-02 MED ORDER — AMLODIPINE BESYLATE 10 MG PO TABS
10.0000 mg | ORAL_TABLET | Freq: Every day | ORAL | Status: DC
  Administered 2016-07-02: 10 mg via ORAL
  Filled 2016-07-02: qty 1

## 2016-07-02 MED ORDER — ACETAMINOPHEN 325 MG PO TABS: 650.0000 mg | ORAL_TABLET | Freq: Four times a day (QID) | ORAL | Status: DC | PRN

## 2016-07-02 MED ORDER — ONDANSETRON HCL 4 MG PO TABS: 4.0000 mg | ORAL_TABLET | Freq: Four times a day (QID) | ORAL | Status: DC | PRN

## 2016-07-02 MED ORDER — SODIUM CHLORIDE 0.9 % IV SOLN
INTRAVENOUS | Status: DC
  Administered 2016-07-02: 06:00:00 via INTRAVENOUS

## 2016-07-02 MED ORDER — SODIUM CHLORIDE 0.9% FLUSH: 3.0000 mL | Freq: Two times a day (BID) | INTRAVENOUS | Status: DC

## 2016-07-02 MED ORDER — POTASSIUM CHLORIDE 20 MEQ PO PACK
40.0000 meq | PACK | Freq: Once | ORAL | Status: AC
  Administered 2016-07-02: 40 meq via ORAL
  Filled 2016-07-02: qty 2

## 2016-07-02 MED ORDER — MAGNESIUM OXIDE 400 (241.3 MG) MG PO TABS: 400.0000 mg | ORAL_TABLET | Freq: Once | ORAL | Status: DC

## 2016-07-02 MED ORDER — HEPARIN SODIUM (PORCINE) 5000 UNIT/ML IJ SOLN
5000.0000 [IU] | Freq: Three times a day (TID) | INTRAMUSCULAR | Status: DC
  Administered 2016-07-02: 5000 [IU] via SUBCUTANEOUS
  Filled 2016-07-02: qty 1

## 2016-07-02 NOTE — Progress Notes (Signed)
*  PRELIMINARY RESULTS* Echocardiogram 2D Echocardiogram has been performed.  Sherrie Sport 07/02/2016, 1:20 PM

## 2016-07-02 NOTE — Progress Notes (Signed)
Shift assessment completed at 0740. Pt ambulated to bathroom with stand by assist, gait reasonabley steady. Pt oriented to self only, pt returned to bed, son at bedside. Pt completed CT scan after this with son's assistance, completed echo as well. Ortho consult completed, cardiology consult completed.. Pt had removed her iv site this am prior to this writer's arrival, bed changed and this writer inserted new piv #22 to l wrist, pt tolerated well. IVF were reinstated, decreased after hospitalist rounds, and finally dc'd when pt dislodged her second piv. Pt's son at bedside requesting d/c tonight due to transportation being unavailabel tomorrow for patient. This Probation officer spoke to DR. Mody who agreed to pt discharge. This Probation officer reviewed d/c instructions with pt's son, who then assisted pt with getting dressed. Pt escorted to front entrance of facility via wc at 1730. Pt in no distress, had ceased complaining of pain to her r hip with movement.

## 2016-07-02 NOTE — ED Notes (Signed)
Patient transported to CT 

## 2016-07-02 NOTE — Progress Notes (Signed)
Michelle Carney is a 83 y.o. female  [id number]  Primary Cardiologist: [name] Reason for Consultation: Elevated troponin  HPI: This 83 year old African-American female with history of hypertension and renal insufficiency presented to the hospital after falling down with pain in the hip. X-rays revealed right hip had no fracture and CT scan revealed osteopenia without dislocation. But patient had mildly elevated troponin thus I was asked to evaluate the patient but denies any chest pain but has been short of breath for a while.   Review of Systems: Patient does have shortness of breath on exertion and at rest with orthopnea PND and leg swelling   Past Medical History:  Diagnosis Date  . Hypertension   . Renal disorder     Medications Prior to Admission  Medication Sig Dispense Refill  . amLODipine (NORVASC) 10 MG tablet Take 10 mg by mouth daily.    Marland Kitchen lovastatin (MEVACOR) 20 MG tablet Take 20 mg by mouth daily at 6 PM.       . amLODipine  10 mg Oral Daily  . docusate sodium  100 mg Oral BID  . heparin  5,000 Units Subcutaneous Q8H  . pravastatin  20 mg Oral q1800  . sodium chloride flush  3 mL Intravenous Q12H    Infusions: . sodium chloride 125 mL/hr at 07/02/16 0602    No Known Allergies  Social History   Social History  . Marital status: Widowed    Spouse name: N/A  . Number of children: N/A  . Years of education: N/A   Occupational History  . Not on file.   Social History Main Topics  . Smoking status: Never Smoker  . Smokeless tobacco: Current User    Types: Snuff  . Alcohol use No  . Drug use: Unknown  . Sexual activity: Not on file   Other Topics Concern  . Not on file   Social History Narrative  . No narrative on file    Family History  Problem Relation Age of Onset  . Family history unknown: Yes    PHYSICAL EXAM: Vitals:   07/02/16 0434 07/02/16 0751  BP: (!) 138/57 (!) 143/85  Pulse: 62 63  Resp: 19   Temp: 97.7 F (36.5  C) 97.5 F (36.4 C)     Intake/Output Summary (Last 24 hours) at 07/02/16 0857 Last data filed at 07/02/16 0140  Gross per 24 hour  Intake              500 ml  Output                0 ml  Net              500 ml    General:  Well appearing. No respiratory difficulty HEENT: normal Neck: supple. no JVD. Carotids 2+ bilat; no bruits. No lymphadenopathy or thryomegaly appreciated. Cor: PMI nondisplaced. Regular rate & rhythm. No rubs, gallops or murmurs. Lungs: clear Abdomen: soft, nontender, nondistended. No hepatosplenomegaly. No bruits or masses. Good bowel sounds. Extremities: no cyanosis, clubbing, rash, edema Neuro: alert & oriented x 3, cranial nerves grossly intact. moves all 4 extremities w/o difficulty. Affect pleasant.  ECG: Sinus rhythm with nonspecific ST-T changes  Results for orders placed or performed during the hospital encounter of 07/01/16 (from the past 24 hour(s))  Basic metabolic panel     Status: Abnormal   Collection Time: 07/01/16  8:04 PM  Result Value Ref Range   Sodium 138 135 - 145  mmol/L   Potassium 3.3 (L) 3.5 - 5.1 mmol/L   Chloride 107 101 - 111 mmol/L   CO2 24 22 - 32 mmol/L   Glucose, Bld 119 (H) 65 - 99 mg/dL   BUN 24 (H) 6 - 20 mg/dL   Creatinine, Ser 1.83 (H) 0.44 - 1.00 mg/dL   Calcium 8.9 8.9 - 10.3 mg/dL   GFR calc non Af Amer 24 (L) >60 mL/min   GFR calc Af Amer 28 (L) >60 mL/min   Anion gap 7 5 - 15  CBC     Status: None   Collection Time: 07/01/16  8:04 PM  Result Value Ref Range   WBC 6.8 3.6 - 11.0 K/uL   RBC 4.12 3.80 - 5.20 MIL/uL   Hemoglobin 12.2 12.0 - 16.0 g/dL   HCT 36.0 35.0 - 47.0 %   MCV 87.6 80.0 - 100.0 fL   MCH 29.7 26.0 - 34.0 pg   MCHC 33.9 32.0 - 36.0 g/dL   RDW 14.4 11.5 - 14.5 %   Platelets 247 150 - 440 K/uL  Urinalysis, Complete w Microscopic     Status: Abnormal   Collection Time: 07/01/16  8:04 PM  Result Value Ref Range   Color, Urine YELLOW (A) YELLOW   APPearance HAZY (A) CLEAR   Specific  Gravity, Urine 1.023 1.005 - 1.030   pH 5.0 5.0 - 8.0   Glucose, UA NEGATIVE NEGATIVE mg/dL   Hgb urine dipstick SMALL (A) NEGATIVE   Bilirubin Urine NEGATIVE NEGATIVE   Ketones, ur NEGATIVE NEGATIVE mg/dL   Protein, ur 100 (A) NEGATIVE mg/dL   Nitrite NEGATIVE NEGATIVE   Leukocytes, UA NEGATIVE NEGATIVE   RBC / HPF 0-5 0 - 5 RBC/hpf   WBC, UA 0-5 0 - 5 WBC/hpf   Bacteria, UA RARE (A) NONE SEEN   Squamous Epithelial / LPF 6-30 (A) NONE SEEN   Mucous PRESENT    Hyaline Casts, UA PRESENT   Troponin I     Status: Abnormal   Collection Time: 07/01/16  8:04 PM  Result Value Ref Range   Troponin I 0.13 (HH) <0.03 ng/mL  Brain natriuretic peptide     Status: None   Collection Time: 07/01/16  8:04 PM  Result Value Ref Range   B Natriuretic Peptide 98.0 0.0 - 100.0 pg/mL  CBC     Status: Abnormal   Collection Time: 07/02/16  7:37 AM  Result Value Ref Range   WBC 5.7 3.6 - 11.0 K/uL   RBC 4.04 3.80 - 5.20 MIL/uL   Hemoglobin 11.9 (L) 12.0 - 16.0 g/dL   HCT 35.7 35.0 - 47.0 %   MCV 88.4 80.0 - 100.0 fL   MCH 29.5 26.0 - 34.0 pg   MCHC 33.4 32.0 - 36.0 g/dL   RDW 14.4 11.5 - 14.5 %   Platelets 187 150 - 440 K/uL   Dg Chest 2 View  Result Date: 07/01/2016 CLINICAL DATA:  Weaker and more altered EXAM: CHEST  2 VIEW COMPARISON:  05/15/2009 FINDINGS: Elevation of the left diaphragm as before. Minimal bibasilar atelectasis or scarring. No acute consolidation or effusion. Borderline cardiomegaly. Atherosclerosis. No pneumothorax. IMPRESSION: Slightly elevated diaphragms with bibasilar atelectasis or scarring. No acute infiltrates. Electronically Signed   By: Donavan Foil M.D.   On: 07/01/2016 23:09   US Venous Img Lower Bilateral  Result Date: 07/02/2016 CLINICAL DATA:  Initial evaluation for new lower extremity swelling, right hip pain, recent fall. EXAM: BILATERAL LOWER EXTREMITY VENOUS DOPPLER ULTRASOUND TECHNIQUE:  Gray-scale sonography with graded compression, as well as color Doppler  and duplex ultrasound were performed to evaluate the lower extremity deep venous systems from the level of the common femoral vein and including the common femoral, femoral, profunda femoral, popliteal and calf veins including the posterior tibial, peroneal and gastrocnemius veins when visible. The superficial great saphenous vein was also interrogated. Spectral Doppler was utilized to evaluate flow at rest and with distal augmentation maneuvers in the common femoral, femoral and popliteal veins. COMPARISON:  None. FINDINGS: RIGHT LOWER EXTREMITY Common Femoral Vein: No evidence of thrombus. Normal compressibility, respiratory phasicity and response to augmentation. Saphenofemoral Junction: No evidence of thrombus. Normal compressibility and flow on color Doppler imaging. Profunda Femoral Vein: No evidence of thrombus. Normal compressibility and flow on color Doppler imaging. Femoral Vein: No evidence of thrombus. Normal compressibility, respiratory phasicity and response to augmentation. Popliteal Vein: No evidence of thrombus. Normal compressibility, respiratory phasicity and response to augmentation. Calf Veins: No evidence of thrombus. Normal compressibility and flow on color Doppler imaging. Superficial Great Saphenous Vein: No evidence of thrombus. Normal compressibility and flow on color Doppler imaging. Venous Reflux:  None. Other Findings:  None. LEFT LOWER EXTREMITY Common Femoral Vein: No evidence of thrombus. Normal compressibility, respiratory phasicity and response to augmentation. Saphenofemoral Junction: No evidence of thrombus. Normal compressibility and flow on color Doppler imaging. Profunda Femoral Vein: No evidence of thrombus. Normal compressibility and flow on color Doppler imaging. Femoral Vein: No evidence of thrombus. Normal compressibility, respiratory phasicity and response to augmentation. Popliteal Vein: No evidence of thrombus. Normal compressibility, respiratory phasicity and response  to augmentation. Calf Veins: No evidence of thrombus. Normal compressibility and flow on color Doppler imaging. Superficial Great Saphenous Vein: No evidence of thrombus. Normal compressibility and flow on color Doppler imaging. Venous Reflux:  None. Other Findings:  None. IMPRESSION: No evidence of deep venous thrombosis. Electronically Signed   By: Jeannine Boga M.D.   On: 07/02/2016 00:29   Dg Hip Unilat W Or Wo Pelvis 2-3 Views Right  Result Date: 07/01/2016 CLINICAL DATA:  83 year old female with fall and right hip pain. EXAM: DG HIP (WITH OR WITHOUT PELVIS) 2-3V RIGHT COMPARISON:  None. FINDINGS: There is advanced osteopenia which limits evaluation for fracture. Linear lucency across the right femoral neck is likely artifactual and related to superimposition of the femoral head. A basicervical femoral neck fracture is less likely but not entirely excluded. CT may provide better evaluation if there is high clinical concern for a femoral neck fracture. There is no dislocation. There is moderate bilateral hip osteoarthritic changes. There is degenerative changes of the lower lumbar spine. Multiple surgical clips noted in the lower abdomen. The soft tissues are grossly unremarkable. IMPRESSION: Linear lucency across the femoral neck is likely artifactual. A nondisplaced fracture is less likely. CT may provide better evaluation if there is high clinical concern for a femoral neck fracture. Advanced osteopenia. Electronically Signed   By: Anner Crete M.D.   On: 07/01/2016 23:36     ASSESSMENT AND PLAN: Congestive heart failure with renal insufficiency and elevated troponin in the setting of this could be demand ischemia. A shouldn't probably has coronary artery disease however and will do echocardiogram to further evaluate. May consider the patient treating conservatively with medications such as aspirin Plavix and nitrates.  Jaspal Pultz A

## 2016-07-02 NOTE — Care Management Obs Status (Signed)
[facility] NOTIFICATION   Patient Details  Name: Michelle Carney MRN: [id number] Date of Birth: May 12, 1929   Medicare Observation Status Notification Given:  Yes    Jolly Mango, RN 07/02/2016, 3:52 PM

## 2016-07-02 NOTE — ED Notes (Signed)
Radiology called stating patient being uncooperative and combative, unable to perform CT scan.  Patient brought back to room, will notify MD.

## 2016-07-02 NOTE — ED Notes (Signed)
Pt taken back to CT to attempt scan after medication administration for pain.

## 2016-07-02 NOTE — Discharge Summary (Signed)
[facility] [place] NAME: Michelle Carney    MR#:  [id number]  DATE OF BIRTH:  [birth year]  DATE OF ADMISSION:  07/01/2016 ADMITTING PHYSICIAN: Harrie Foreman, MD  DATE OF DISCHARGE: 07/02/2016  PRIMARY CARE PHYSICIAN: [facility]    ADMISSION DIAGNOSIS:  Lower extremity edema [R60.0] Acute renal insufficiency [N28.9] Elevated troponin [R74.8] Right hip pain [M25.551] Fall, initial encounter [W19.XXXA] Chronic dementia without behavioral disturbance [F03.90]  DISCHARGE DIAGNOSIS:  Active Problems:   Syncope   SECONDARY DIAGNOSIS:   Past Medical History:  Diagnosis Date  . Hypertension   . Renal disorder     HOSPITAL COURSE:  83 year old female with history of dementia, hyperlipidemia and hypertension who presents after mechanical fall complaining of right hip pain and found to have elevated troponin.  1. Elevated troponin due to demand ischemia: Echo shows no WMA and normal Ef. Cardiology did not feel this was from ACS. Troponin levels were flat at 0.12->0.15 x 2.   2. Right hip pain: She was actually ambulating quite well. Ct HIP did not show necrosis or fracture. She was evaluated by Ortho who felt patient would benefit from PT.  3 hypokalemia:Repleted. Magnesium was low as well and this was repleted.  4. Hyperlipidemia: Continue Pravachol  5. Essential hypertension: Continue Norvasc   DISCHARGE CONDITIONS AND DIET:  Stable Cardiac diet  CONSULTS OBTAINED:  Treatment Team:  Dionisio David, MD Earnestine Leys, MD  DRUG ALLERGIES:  No Known Allergies  DISCHARGE MEDICATIONS:   Current Discharge Medication List    CONTINUE these medications which have NOT CHANGED   Details  amLODipine (NORVASC) 10 MG tablet Take 10 mg by mouth daily.    lovastatin (MEVACOR) 20 MG tablet Take 20 mg by mouth daily at 6 PM.          Today   CHIEF COMPLAINT:  Doing well this am Family would like  for patient to be discharged as soon as possible.she is at her baseline   VITAL SIGNS:  Blood pressure (!) 143/85, pulse 63, temperature 97.5 F (36.4 C), temperature source Oral, resp. rate 19, height 5\' 6"  (1.676 m), weight 68 kg (150 lb), SpO2 99 %.   REVIEW OF SYSTEMS:  Review of Systems  Constitutional: Negative.  Negative for chills, fever and malaise/fatigue.  HENT: Negative.  Negative for ear discharge, ear pain, hearing loss, nosebleeds and sore throat.   Eyes: Negative.  Negative for blurred vision and pain.  Respiratory: Negative.  Negative for cough, hemoptysis, shortness of breath and wheezing.   Cardiovascular: Negative.  Negative for chest pain, palpitations and leg swelling.  Gastrointestinal: Negative.  Negative for abdominal pain, blood in stool, diarrhea, nausea and vomiting.  Genitourinary: Negative.  Negative for dysuria.  Musculoskeletal: Negative.  Negative for back pain.  Skin: Negative.   Neurological: Negative for dizziness, tremors, speech change, focal weakness, seizures and headaches.  Endo/Heme/Allergies: Negative.  Does not bruise/bleed easily.  Psychiatric/Behavioral: Positive for memory loss. Negative for depression, hallucinations and suicidal ideas.     PHYSICAL EXAMINATION:  GENERAL:  83 y.o.-year-old patient lying in the bed with no acute distress.  NECK:  Supple, no jugular venous distention. No thyroid enlargement, no tenderness.  LUNGS: Normal breath sounds bilaterally, no wheezing, rales,rhonchi  No use of accessory muscles of respiration.  CARDIOVASCULAR: S1, S2 normal. No murmurs, rubs, or gallops.  ABDOMEN: Soft, non-tender, non-distended. Bowel sounds present. No organomegaly or mass.  EXTREMITIES: No pedal edema, cyanosis,  or clubbing.  Good ROM hip no tenderness PSYCHIATRIC: The patient is alert and oriented x name and place SKIN: No obvious rash, lesion, or ulcer.   DATA REVIEW:   CBC  Recent Labs Lab 07/02/16 0737  WBC 5.7   HGB 11.9*  HCT 35.7  PLT 187    Chemistries   Recent Labs Lab 07/02/16 1201  NA 139  K 3.7  CL 110  CO2 24  GLUCOSE 102*  BUN 20  CREATININE 1.41*  CALCIUM 8.7*  MG 1.6*    Cardiac Enzymes  Recent Labs Lab 07/02/16 0737 07/02/16 1201 07/02/16 1614  TROPONINI 0.12* 0.15* 0.15*    Microbiology Results  @[email or web address] @  RADIOLOGY:  Dg Chest 2 View  Result Date: 07/01/2016 CLINICAL DATA:  Weaker and more altered EXAM: CHEST  2 VIEW COMPARISON:  05/15/2009 FINDINGS: Elevation of the left diaphragm as before. Minimal bibasilar atelectasis or scarring. No acute consolidation or effusion. Borderline cardiomegaly. Atherosclerosis. No pneumothorax. IMPRESSION: Slightly elevated diaphragms with bibasilar atelectasis or scarring. No acute infiltrates. Electronically Signed   By: Donavan Foil M.D.   On: 07/01/2016 23:09   Ct Hip Right Wo Contrast  Result Date: 07/02/2016 CLINICAL DATA:  [place] 1 week ago.  Persistent right hip pain. EXAM: CT OF THE RIGHT HIP WITHOUT CONTRAST TECHNIQUE: Multidetector CT imaging of the right hip was performed according to the standard protocol. Multiplanar CT image reconstructions were also generated. COMPARISON:  Radiographs 07/01/2016 FINDINGS: The right hip is normally located. Moderate degenerative changes with joint space narrowing and spurring. No hip fracture. No AVN. Moderate degenerative changes noted at the pubic symphysis but no pubic rami fractures. Moderate fatty atrophy of the gluteus minimus muscle. No muscle hematoma. Calcification noted in the subcutaneous tissues overlying the right proximal femur possibly related to remote trauma. No significant intrapelvic abnormalities are identified. No right inguinal mass or hernia. Vascular calcifications are noted. IMPRESSION: 1. No acute right hip fracture or evidence of AVN. 2. Moderate right hip joint degenerative changes. 3. The visualized bony pelvis is intact.  The Electronically Signed   By:  Marijo Sanes M.D.   On: 07/02/2016 09:43   US Venous Img Lower Bilateral  Result Date: 07/02/2016 CLINICAL DATA:  Initial evaluation for new lower extremity swelling, right hip pain, recent fall. EXAM: BILATERAL LOWER EXTREMITY VENOUS DOPPLER ULTRASOUND TECHNIQUE: Gray-scale sonography with graded compression, as well as color Doppler and duplex ultrasound were performed to evaluate the lower extremity deep venous systems from the level of the common femoral vein and including the common femoral, femoral, profunda femoral, popliteal and calf veins including the posterior tibial, peroneal and gastrocnemius veins when visible. The superficial great saphenous vein was also interrogated. Spectral Doppler was utilized to evaluate flow at rest and with distal augmentation maneuvers in the common femoral, femoral and popliteal veins. COMPARISON:  None. FINDINGS: RIGHT LOWER EXTREMITY Common Femoral Vein: No evidence of thrombus. Normal compressibility, respiratory phasicity and response to augmentation. Saphenofemoral Junction: No evidence of thrombus. Normal compressibility and flow on color Doppler imaging. Profunda Femoral Vein: No evidence of thrombus. Normal compressibility and flow on color Doppler imaging. Femoral Vein: No evidence of thrombus. Normal compressibility, respiratory phasicity and response to augmentation. Popliteal Vein: No evidence of thrombus. Normal compressibility, respiratory phasicity and response to augmentation. Calf Veins: No evidence of thrombus. Normal compressibility and flow on color Doppler imaging. Superficial Great Saphenous Vein: No evidence of thrombus. Normal compressibility and flow on color Doppler imaging. Venous Reflux:  None. Other Findings:  None. LEFT LOWER EXTREMITY Common Femoral Vein: No evidence of thrombus. Normal compressibility, respiratory phasicity and response to augmentation. Saphenofemoral Junction: No evidence of thrombus. Normal compressibility and flow on  color Doppler imaging. Profunda Femoral Vein: No evidence of thrombus. Normal compressibility and flow on color Doppler imaging. Femoral Vein: No evidence of thrombus. Normal compressibility, respiratory phasicity and response to augmentation. Popliteal Vein: No evidence of thrombus. Normal compressibility, respiratory phasicity and response to augmentation. Calf Veins: No evidence of thrombus. Normal compressibility and flow on color Doppler imaging. Superficial Great Saphenous Vein: No evidence of thrombus. Normal compressibility and flow on color Doppler imaging. Venous Reflux:  None. Other Findings:  None. IMPRESSION: No evidence of deep venous thrombosis. Electronically Signed   By: Jeannine Boga M.D.   On: 07/02/2016 00:29   Dg Hip Unilat W Or Wo Pelvis 2-3 Views Right  Result Date: 07/01/2016 CLINICAL DATA:  84 year old female with fall and right hip pain. EXAM: DG HIP (WITH OR WITHOUT PELVIS) 2-3V RIGHT COMPARISON:  None. FINDINGS: There is advanced osteopenia which limits evaluation for fracture. Linear lucency across the right femoral neck is likely artifactual and related to superimposition of the femoral head. A basicervical femoral neck fracture is less likely but not entirely excluded. CT may provide better evaluation if there is high clinical concern for a femoral neck fracture. There is no dislocation. There is moderate bilateral hip osteoarthritic changes. There is degenerative changes of the lower lumbar spine. Multiple surgical clips noted in the lower abdomen. The soft tissues are grossly unremarkable. IMPRESSION: Linear lucency across the femoral neck is likely artifactual. A nondisplaced fracture is less likely. CT may provide better evaluation if there is high clinical concern for a femoral neck fracture. Advanced osteopenia. Electronically Signed   By: Anner Crete M.D.   On: 07/01/2016 23:36      Current Discharge Medication List    CONTINUE these medications which have  NOT CHANGED   Details  amLODipine (NORVASC) 10 MG tablet Take 10 mg by mouth daily.    lovastatin (MEVACOR) 20 MG tablet Take 20 mg by mouth daily at 6 PM.         Management plans discussed with the patient and family and they are in agreement. Stable for discharge home  Patient should follow up with pcp  CODE STATUS:     Code Status Orders        Start     Ordered   07/02/16 0432  Full code  Continuous     07/02/16 0431    Code Status History    Date Active Date Inactive Code Status Order ID Comments User Context   This patient has a current code status but no historical code status.      TOTAL TIME TAKING CARE OF THIS PATIENT: 37 minutes.    Note: This dictation was prepared with Dragon dictation along with smaller phrase technology. Any transcriptional errors that result from this process are unintentional.  Sabin Gibeault M.D on 07/02/2016 at 5:09 PM  Between 7am to 6pm - Pager - [phone] After 6pm go to [email or web address] - password [email or web address] [facility]  Office  [phone]  CC: Primary care physician; [facility]

## 2016-07-02 NOTE — Progress Notes (Signed)
[facility] [place] NAME: Michelle Carney    MR#:  [id number]  DATE OF BIRTH:  08/26/1929  SUBJECTIVE:   patient with dementia Family at bedside Family member Thinks patient may have slipped while trying to use the restroom Patient was ambulating this morning  REVIEW OF SYSTEMS:    Review of Systems  Constitutional: Negative.  Negative for chills, fever and malaise/fatigue.  HENT: Negative.  Negative for ear discharge, ear pain, hearing loss, nosebleeds and sore throat.   Eyes: Negative.  Negative for blurred vision and pain.  Respiratory: Negative.  Negative for cough, hemoptysis, shortness of breath and wheezing.   Cardiovascular: Negative.  Negative for chest pain, palpitations and leg swelling.  Gastrointestinal: Negative.  Negative for abdominal pain, blood in stool, diarrhea, nausea and vomiting.  Genitourinary: Negative.  Negative for dysuria.  Musculoskeletal: Positive for falls. Negative for back pain.       Hip pain  Skin: Negative.   Neurological: Negative for dizziness, tremors, speech change, focal weakness, seizures and headaches.  Endo/Heme/Allergies: Negative.  Does not bruise/bleed easily.  Psychiatric/Behavioral: Positive for memory loss. Negative for depression, hallucinations and suicidal ideas.    Tolerating Diet: yes      DRUG ALLERGIES:  No Known Allergies  VITALS:  Blood pressure (!) 143/85, pulse 63, temperature 97.5 F (36.4 C), temperature source Oral, resp. rate 19, height 5\' 6"  (1.676 m), weight 68 kg (150 lb), SpO2 99 %.  PHYSICAL EXAMINATION:   Physical Exam  Constitutional: She is well-developed, well-nourished, and in no distress. No distress.  HENT:  Head: Normocephalic.  Eyes: No scleral icterus.  Neck: Normal range of motion. Neck supple. No JVD present. No tracheal deviation present.  Cardiovascular: Normal rate, regular rhythm and normal heart sounds.  Exam reveals no gallop and no  friction rub.   No murmur heard. Pulmonary/Chest: Effort normal and breath sounds normal. No respiratory distress. She has no wheezes. She has no rales. She exhibits no tenderness.  Abdominal: Soft. Bowel sounds are normal. She exhibits no distension and no mass. There is no tenderness. There is no rebound and no guarding.  Musculoskeletal: Normal range of motion. She exhibits no edema.  Neurological: She is alert.  Oriented to name and place  Skin: Skin is warm. No rash noted. No erythema.  Psychiatric: Affect normal.      LABORATORY PANEL:   CBC  Recent Labs Lab 07/02/16 0737  WBC 5.7  HGB 11.9*  HCT 35.7  PLT 187   ------------------------------------------------------------------------------------------------------------------  Chemistries   Recent Labs Lab 07/01/16 2004  NA 138  K 3.3*  CL 107  CO2 24  GLUCOSE 119*  BUN 24*  CREATININE 1.83*  CALCIUM 8.9   ------------------------------------------------------------------------------------------------------------------  Cardiac Enzymes  Recent Labs Lab 07/01/16 2004  TROPONINI 0.13*   ------------------------------------------------------------------------------------------------------------------  RADIOLOGY:  Dg Chest 2 View  Result Date: 07/01/2016 CLINICAL DATA:  Weaker and more altered EXAM: CHEST  2 VIEW COMPARISON:  05/15/2009 FINDINGS: Elevation of the left diaphragm as before. Minimal bibasilar atelectasis or scarring. No acute consolidation or effusion. Borderline cardiomegaly. Atherosclerosis. No pneumothorax. IMPRESSION: Slightly elevated diaphragms with bibasilar atelectasis or scarring. No acute infiltrates. Electronically Signed   By: Donavan Foil M.D.   On: 07/01/2016 23:09   US Venous Img Lower Bilateral  Result Date: 07/02/2016 CLINICAL DATA:  Initial evaluation for new lower extremity swelling, right hip pain, recent fall. EXAM: BILATERAL LOWER EXTREMITY VENOUS DOPPLER ULTRASOUND  TECHNIQUE: Gray-scale sonography with  graded compression, as well as color Doppler and duplex ultrasound were performed to evaluate the lower extremity deep venous systems from the level of the common femoral vein and including the common femoral, femoral, profunda femoral, popliteal and calf veins including the posterior tibial, peroneal and gastrocnemius veins when visible. The superficial great saphenous vein was also interrogated. Spectral Doppler was utilized to evaluate flow at rest and with distal augmentation maneuvers in the common femoral, femoral and popliteal veins. COMPARISON:  None. FINDINGS: RIGHT LOWER EXTREMITY Common Femoral Vein: No evidence of thrombus. Normal compressibility, respiratory phasicity and response to augmentation. Saphenofemoral Junction: No evidence of thrombus. Normal compressibility and flow on color Doppler imaging. Profunda Femoral Vein: No evidence of thrombus. Normal compressibility and flow on color Doppler imaging. Femoral Vein: No evidence of thrombus. Normal compressibility, respiratory phasicity and response to augmentation. Popliteal Vein: No evidence of thrombus. Normal compressibility, respiratory phasicity and response to augmentation. Calf Veins: No evidence of thrombus. Normal compressibility and flow on color Doppler imaging. Superficial Great Saphenous Vein: No evidence of thrombus. Normal compressibility and flow on color Doppler imaging. Venous Reflux:  None. Other Findings:  None. LEFT LOWER EXTREMITY Common Femoral Vein: No evidence of thrombus. Normal compressibility, respiratory phasicity and response to augmentation. Saphenofemoral Junction: No evidence of thrombus. Normal compressibility and flow on color Doppler imaging. Profunda Femoral Vein: No evidence of thrombus. Normal compressibility and flow on color Doppler imaging. Femoral Vein: No evidence of thrombus. Normal compressibility, respiratory phasicity and response to augmentation. Popliteal Vein:  No evidence of thrombus. Normal compressibility, respiratory phasicity and response to augmentation. Calf Veins: No evidence of thrombus. Normal compressibility and flow on color Doppler imaging. Superficial Great Saphenous Vein: No evidence of thrombus. Normal compressibility and flow on color Doppler imaging. Venous Reflux:  None. Other Findings:  None. IMPRESSION: No evidence of deep venous thrombosis. Electronically Signed   By: Jeannine Boga M.D.   On: 07/02/2016 00:29   Dg Hip Unilat W Or Wo Pelvis 2-3 Views Right  Result Date: 07/01/2016 CLINICAL DATA:  83 year old female with fall and right hip pain. EXAM: DG HIP (WITH OR WITHOUT PELVIS) 2-3V RIGHT COMPARISON:  None. FINDINGS: There is advanced osteopenia which limits evaluation for fracture. Linear lucency across the right femoral neck is likely artifactual and related to superimposition of the femoral head. A basicervical femoral neck fracture is less likely but not entirely excluded. CT may provide better evaluation if there is high clinical concern for a femoral neck fracture. There is no dislocation. There is moderate bilateral hip osteoarthritic changes. There is degenerative changes of the lower lumbar spine. Multiple surgical clips noted in the lower abdomen. The soft tissues are grossly unremarkable. IMPRESSION: Linear lucency across the femoral neck is likely artifactual. A nondisplaced fracture is less likely. CT may provide better evaluation if there is high clinical concern for a femoral neck fracture. Advanced osteopenia. Electronically Signed   By: Anner Crete M.D.   On: 07/01/2016 23:36     ASSESSMENT AND PLAN:   83 year old female with history of dementia, hyperlipidemia and hypertension who presents after mechanical fall complaining of right hip pain and found to have elevated troponin.  1. Elevated troponin due to demand ischemia, likely Appreciate cardiology consult Follow troponins and telemetry  monitoring Follow up on echo  2. Right hip pain: Follow up on orthopedic surgery consultation as well as CT hip Physical therapy consultation requested once cleared by orthopedic  3 hypokalemia: We will recheck a.m. labs including magnesium and  replace as needed  4. Hyperlipidemia: Continue Pravachol  5. Essential hypertension: Continue Norvasc  6. Dementia with agitation at times: Add Risperdal when necessary 7. Acute kidney injury: Follow-up on a.m. creatinine Continue low-dose IV fluids  Management plans discussed with the patient and  Family and they are in agreement.  CODE STATUS: FULL  TOTAL TIME TAKING CARE OF THIS PATIENT: 45 minutes.     POSSIBLE D/C 1-2 days, DEPENDING ON CLINICAL CONDITION.   Haynes Giannotti M.D on 07/02/2016 at 9:04 AM  Between 7am to 6pm - Pager - [phone] After 6pm go to [email or web address] - password [email or web address] [facility]  Office  [phone]  CC: Primary care physician; [facility]  Note: This dictation was prepared with Dragon dictation along with smaller phrase technology. Any transcriptional errors that result from this process are unintentional.

## 2016-07-02 NOTE — H&P (Signed)
Michelle Carney is an 83 y.o. female.   Chief Complaint: Hip pain HPI: The patient with past medical history of hypertension presents emergency department complaining of right hip pain. This has been a subacute issue for a long time but it worsened a few weeks ago after the patient was found slumped off the side of her toilet at home. It is unclear if the patient fainted at that time but she has denied chest pain or shortness of breath, nausea, vomiting, diarrhea or dizziness. Her son has noticed that she becomes very fatigued quite quickly and today was unable to walk without complete assistance which prompted him to seek evaluation in the emergency department. X-ray of her right hip showed no definite fracture although CT was recommended to evaluate osteopenia versus nondisplaced fracture. Laboratory evaluation was significant for elevated troponin levels and elevated BUN/creatinine. Thus the emergency department called the hospitalist service for further evaluation.  Past Medical History:  Diagnosis Date  . Hypertension   . Renal disorder     Past Surgical History:  Procedure Laterality Date  . ABDOMINAL HYSTERECTOMY    . CHOLECYSTECTOMY      Family History  Problem Relation Age of Onset  . Family history unknown: Yes  No heart disease or diabetes to her son's knowledge   Social History:  reports that she has never smoked. Her smokeless tobacco use includes Snuff. She reports that she does not drink alcohol. Her drug history is not on file.  Allergies: No Known Allergies  Medications Prior to Admission  Medication Sig Dispense Refill  . amLODipine (NORVASC) 10 MG tablet Take 10 mg by mouth daily.    Marland Kitchen lovastatin (MEVACOR) 20 MG tablet Take 20 mg by mouth daily at 6 PM.      Results for orders placed or performed during the hospital encounter of 07/01/16 (from the past 48 hour(s))  Basic metabolic panel     Status: Abnormal   Collection Time: 07/01/16  8:04 PM  Result Value Ref  Range   Sodium 138 135 - 145 mmol/L   Potassium 3.3 (L) 3.5 - 5.1 mmol/L   Chloride 107 101 - 111 mmol/L   CO2 24 22 - 32 mmol/L   Glucose, Bld 119 (H) 65 - 99 mg/dL   BUN 24 (H) 6 - 20 mg/dL   Creatinine, Ser 1.83 (H) 0.44 - 1.00 mg/dL   Calcium 8.9 8.9 - 10.3 mg/dL   GFR calc non Af Amer 24 (L) >60 mL/min   GFR calc Af Amer 28 (L) >60 mL/min    Comment: (NOTE) The eGFR has been calculated using the CKD EPI equation. This calculation has not been validated in all clinical situations. eGFR's persistently <60 mL/min signify possible Chronic Kidney Disease.    Anion gap 7 5 - 15  CBC     Status: None   Collection Time: 07/01/16  8:04 PM  Result Value Ref Range   WBC 6.8 3.6 - 11.0 K/uL   RBC 4.12 3.80 - 5.20 MIL/uL   Hemoglobin 12.2 12.0 - 16.0 g/dL   HCT 36.0 35.0 - 47.0 %   MCV 87.6 80.0 - 100.0 fL   MCH 29.7 26.0 - 34.0 pg   MCHC 33.9 32.0 - 36.0 g/dL   RDW 14.4 11.5 - 14.5 %   Platelets 247 150 - 440 K/uL  Urinalysis, Complete w Microscopic     Status: Abnormal   Collection Time: 07/01/16  8:04 PM  Result Value Ref Range   Color,  Urine YELLOW (A) YELLOW   APPearance HAZY (A) CLEAR   Specific Gravity, Urine 1.023 1.005 - 1.030   pH 5.0 5.0 - 8.0   Glucose, UA NEGATIVE NEGATIVE mg/dL   Hgb urine dipstick SMALL (A) NEGATIVE   Bilirubin Urine NEGATIVE NEGATIVE   Ketones, ur NEGATIVE NEGATIVE mg/dL   Protein, ur 100 (A) NEGATIVE mg/dL   Nitrite NEGATIVE NEGATIVE   Leukocytes, UA NEGATIVE NEGATIVE   RBC / HPF 0-5 0 - 5 RBC/hpf   WBC, UA 0-5 0 - 5 WBC/hpf   Bacteria, UA RARE (A) NONE SEEN   Squamous Epithelial / LPF 6-30 (A) NONE SEEN   Mucous PRESENT    Hyaline Casts, UA PRESENT   Troponin I     Status: Abnormal   Collection Time: 07/01/16  8:04 PM  Result Value Ref Range   Troponin I 0.13 (HH) <0.03 ng/mL    Comment: CRITICAL RESULT CALLED TO, READ BACK BY AND VERIFIED WITH RAQUEL DAVID AT 2059 ON 07/01/2016 JLJ   Brain natriuretic peptide     Status: None    Collection Time: 07/01/16  8:04 PM  Result Value Ref Range   B Natriuretic Peptide 98.0 0.0 - 100.0 pg/mL   Dg Chest 2 View  Result Date: 07/01/2016 CLINICAL DATA:  Weaker and more altered EXAM: CHEST  2 VIEW COMPARISON:  05/15/2009 FINDINGS: Elevation of the left diaphragm as before. Minimal bibasilar atelectasis or scarring. No acute consolidation or effusion. Borderline cardiomegaly. Atherosclerosis. No pneumothorax. IMPRESSION: Slightly elevated diaphragms with bibasilar atelectasis or scarring. No acute infiltrates. Electronically Signed   By: Donavan Foil M.D.   On: 07/01/2016 23:09   US Venous Img Lower Bilateral  Result Date: 07/02/2016 CLINICAL DATA:  Initial evaluation for new lower extremity swelling, right hip pain, recent fall. EXAM: BILATERAL LOWER EXTREMITY VENOUS DOPPLER ULTRASOUND TECHNIQUE: Gray-scale sonography with graded compression, as well as color Doppler and duplex ultrasound were performed to evaluate the lower extremity deep venous systems from the level of the common femoral vein and including the common femoral, femoral, profunda femoral, popliteal and calf veins including the posterior tibial, peroneal and gastrocnemius veins when visible. The superficial great saphenous vein was also interrogated. Spectral Doppler was utilized to evaluate flow at rest and with distal augmentation maneuvers in the common femoral, femoral and popliteal veins. COMPARISON:  None. FINDINGS: RIGHT LOWER EXTREMITY Common Femoral Vein: No evidence of thrombus. Normal compressibility, respiratory phasicity and response to augmentation. Saphenofemoral Junction: No evidence of thrombus. Normal compressibility and flow on color Doppler imaging. Profunda Femoral Vein: No evidence of thrombus. Normal compressibility and flow on color Doppler imaging. Femoral Vein: No evidence of thrombus. Normal compressibility, respiratory phasicity and response to augmentation. Popliteal Vein: No evidence of thrombus.  Normal compressibility, respiratory phasicity and response to augmentation. Calf Veins: No evidence of thrombus. Normal compressibility and flow on color Doppler imaging. Superficial Great Saphenous Vein: No evidence of thrombus. Normal compressibility and flow on color Doppler imaging. Venous Reflux:  None. Other Findings:  None. LEFT LOWER EXTREMITY Common Femoral Vein: No evidence of thrombus. Normal compressibility, respiratory phasicity and response to augmentation. Saphenofemoral Junction: No evidence of thrombus. Normal compressibility and flow on color Doppler imaging. Profunda Femoral Vein: No evidence of thrombus. Normal compressibility and flow on color Doppler imaging. Femoral Vein: No evidence of thrombus. Normal compressibility, respiratory phasicity and response to augmentation. Popliteal Vein: No evidence of thrombus. Normal compressibility, respiratory phasicity and response to augmentation. Calf Veins: No evidence of thrombus. Normal compressibility  and flow on color Doppler imaging. Superficial Great Saphenous Vein: No evidence of thrombus. Normal compressibility and flow on color Doppler imaging. Venous Reflux:  None. Other Findings:  None. IMPRESSION: No evidence of deep venous thrombosis. Electronically Signed   By: Jeannine Boga M.D.   On: 07/02/2016 00:29   Dg Hip Unilat W Or Wo Pelvis 2-3 Views Right  Result Date: 07/01/2016 CLINICAL DATA:  83 year old female with fall and right hip pain. EXAM: DG HIP (WITH OR WITHOUT PELVIS) 2-3V RIGHT COMPARISON:  None. FINDINGS: There is advanced osteopenia which limits evaluation for fracture. Linear lucency across the right femoral neck is likely artifactual and related to superimposition of the femoral head. A basicervical femoral neck fracture is less likely but not entirely excluded. CT may provide better evaluation if there is high clinical concern for a femoral neck fracture. There is no dislocation. There is moderate bilateral hip  osteoarthritic changes. There is degenerative changes of the lower lumbar spine. Multiple surgical clips noted in the lower abdomen. The soft tissues are grossly unremarkable. IMPRESSION: Linear lucency across the femoral neck is likely artifactual. A nondisplaced fracture is less likely. CT may provide better evaluation if there is high clinical concern for a femoral neck fracture. Advanced osteopenia. Electronically Signed   By: Anner Crete M.D.   On: 07/01/2016 23:36    Review of Systems  Constitutional: Positive for malaise/fatigue. Negative for chills and fever.  HENT: Negative for sore throat and tinnitus.   Eyes: Negative for blurred vision and redness.  Respiratory: Negative for cough and shortness of breath.   Cardiovascular: Negative for chest pain, palpitations, orthopnea and PND.  Gastrointestinal: Negative for abdominal pain, diarrhea, nausea and vomiting.  Genitourinary: Negative for dysuria, frequency and urgency.  Musculoskeletal: Positive for joint pain. Negative for myalgias.  Skin: Negative for rash.       No lesions  Neurological: Negative for speech change, focal weakness and weakness.  Endo/Heme/Allergies: Does not bruise/bleed easily.       No temperature intolerance  Psychiatric/Behavioral: Negative for depression and suicidal ideas.    Blood pressure (!) 138/57, pulse 62, temperature 97.7 F (36.5 C), temperature source Oral, resp. rate 19, height 5' 6" (1.676 m), weight 68.9 kg (152 lb), SpO2 100 %. Physical Exam  Vitals reviewed. Constitutional: She is oriented to person, place, and time. She appears well-developed and well-nourished. No distress.  HENT:  Head: Normocephalic and atraumatic.  Mouth/Throat: Oropharynx is clear and moist.  Eyes: Conjunctivae and EOM are normal. Pupils are equal, round, and reactive to light. No scleral icterus.  Neck: Normal range of motion. Neck supple. JVD present. No tracheal deviation present. No thyromegaly present.   Cardiovascular: Normal rate, regular rhythm and normal heart sounds.  Exam reveals no gallop and no friction rub.   No murmur heard. Respiratory: Effort normal and breath sounds normal.  GI: Soft. Bowel sounds are normal. She exhibits no distension. There is no tenderness.  Genitourinary:  Genitourinary Comments: Deferred  Musculoskeletal: Normal range of motion. She exhibits no edema.  Lymphadenopathy:    She has no cervical adenopathy.  Neurological: She is alert and oriented to person, place, and time. No cranial nerve deficit. She exhibits normal muscle tone.  Skin: Skin is warm and dry. No rash noted. No erythema.  Psychiatric: She has a normal mood and affect. Her behavior is normal. Judgment and thought content normal.     Assessment/Plan This is an 83 year old female admitted for acute on chronic kidney injury and  elevated troponin. 1. Elevated troponin: Risk factors for ACS do not appear to be present. The patient also denies chest pain or shortness of breath. No indication of ischemia on EKG although we will continue to monitor telemetry and follow cardiac enzymes. 2. Acute on chronic kidney injury: Hydrate with intravenous fluid. Avoid nephrotoxic agents. May explain poor clearance of cardiac enzymes. 3. Fatigue and malaise: Check TSH and hemoglobin A1c. The patient has significant JVD on physical exam which may indicate some diastolic heart failure. I have ordered an echocardiogram as well as cardiology consult. 4. Hip pain: I have ordered a CT of the hip. Check vitamin D level. Orthopedic surgery has been consulted. 5. Hypertension: Controlled; continue amlodipine 6. Hyperlipidemia: Continue statin therapy 7. DVT prophylaxis: Heparin 8. GI prophylaxis: None The patient is a full code. Time spent on admission orders and patient care approximately 45 minutes  Harrie Foreman, MD 07/02/2016, 5:34 AM

## 2016-07-02 NOTE — Progress Notes (Signed)
MEDICATION RELATED CONSULT NOTE - INITIAL   Pharmacy Consult for Electrolyte management Indication: electrolyte management   No Known Allergies  Patient Measurements: Height: 5\' 6"  (167.6 cm) Weight: 150 lb (68 kg) IBW/kg (Calculated) : 59.3 Adjusted Body Weight:   Vital Signs: Temp: 97.5 F (36.4 C) (03/22 0751) Temp Source: Oral (03/22 0751) BP: 143/85 (03/22 0751) Pulse Rate: 63 (03/22 0751) Intake/Output from previous day: 03/21 0701 - 03/22 0700 In: 500 [IV Piggyback:500] Out: -  Intake/Output from this shift: No intake/output data recorded.  Labs:  Recent Labs  07/01/16 2004 07/02/16 0737  WBC 6.8 5.7  HGB 12.2 11.9*  HCT 36.0 35.7  PLT 247 187  CREATININE 1.83*  --    Estimated Creatinine Clearance: 21 mL/min (A) (by C-G formula based on SCr of 1.83 mg/dL (H)).   Microbiology: No results found for this or any previous visit (from the past 720 hour(s)).  Medical History: Past Medical History:  Diagnosis Date  . Hypertension   . Renal disorder     Medications:  Prescriptions Prior to Admission  Medication Sig Dispense Refill Last Dose  . amLODipine (NORVASC) 10 MG tablet Take 10 mg by mouth daily.   07/01/2016 at Unknown time  . lovastatin (MEVACOR) 20 MG tablet Take 20 mg by mouth daily at 6 PM.   07/01/2016 at Unknown time   Scheduled:  . amLODipine  10 mg Oral Daily  . docusate sodium  100 mg Oral BID  . heparin  5,000 Units Subcutaneous Q8H  . potassium chloride  40 mEq Oral Once  . pravastatin  20 mg Oral q1800  . sodium chloride flush  3 mL Intravenous Q12H    Assessment: Pharmacy consulted to assist with the management in electrolytes  Previous electrolytes were all within normal limits except K= 3.3. Magnesium: 1.6     Goal of Therapy:    Plan:  Will give KCl 40 mEq  PO packet x 1.   ADD:   Will give Magnesium oxide 400 mg PO x 1   Will recheck labs in the am Meganne Rita D 07/02/2016,9:15 AM

## 2016-07-02 NOTE — Consult Note (Signed)
ORTHOPAEDIC CONSULTATION  REQUESTING PHYSICIAN: Bettey Costa, MD  Chief Complaint: Occasional right hip soreness  HPI: Michelle Carney is a 83 y.o. female who complains of  occasional right hip soreness.  According to her son.. She apparently has some dementia as well.  He was brought to the emergency room last night for evaluation.  Exam and x-rays did not show any evidence for hip fracture or pelvic fracture.  Plain x-rays and CT scan were done and were negative.  Her son says that she was walking without external aids yesterday without problem.  She may have fallen week or 2 ago he thinks.  She has other medical problems which are being evaluated.  Past Medical History:  Diagnosis Date  . Hypertension   . Renal disorder    Past Surgical History:  Procedure Laterality Date  . ABDOMINAL HYSTERECTOMY    . CHOLECYSTECTOMY     Social History   Social History  . Marital status: Widowed    Spouse name: N/A  . Number of children: N/A  . Years of education: N/A   Social History Main Topics  . Smoking status: Never Smoker  . Smokeless tobacco: Current User    Types: Snuff  . Alcohol use No  . Drug use: Unknown  . Sexual activity: Not Asked   Other Topics Concern  . None   Social History Narrative  . None   Family History  Problem Relation Age of Onset  . Family history unknown: Yes   No Known Allergies Prior to Admission medications   Medication Sig Start Date End Date Taking? Authorizing Provider  amLODipine (NORVASC) 10 MG tablet Take 10 mg by mouth daily.   Yes Historical Provider, MD  lovastatin (MEVACOR) 20 MG tablet Take 20 mg by mouth daily at 6 PM.   Yes Historical Provider, MD   Dg Chest 2 View  Result Date: 07/01/2016 CLINICAL DATA:  Weaker and more altered EXAM: CHEST  2 VIEW COMPARISON:  05/15/2009 FINDINGS: Elevation of the left diaphragm as before. Minimal bibasilar atelectasis or scarring. No acute consolidation or effusion. Borderline cardiomegaly.  Atherosclerosis. No pneumothorax. IMPRESSION: Slightly elevated diaphragms with bibasilar atelectasis or scarring. No acute infiltrates. Electronically Signed   By: Donavan Foil M.D.   On: 07/01/2016 23:09   Ct Hip Right Wo Contrast  Result Date: 07/02/2016 CLINICAL DATA:  [place] 1 week ago.  Persistent right hip pain. EXAM: CT OF THE RIGHT HIP WITHOUT CONTRAST TECHNIQUE: Multidetector CT imaging of the right hip was performed according to the standard protocol. Multiplanar CT image reconstructions were also generated. COMPARISON:  Radiographs 07/01/2016 FINDINGS: The right hip is normally located. Moderate degenerative changes with joint space narrowing and spurring. No hip fracture. No AVN. Moderate degenerative changes noted at the pubic symphysis but no pubic rami fractures. Moderate fatty atrophy of the gluteus minimus muscle. No muscle hematoma. Calcification noted in the subcutaneous tissues overlying the right proximal femur possibly related to remote trauma. No significant intrapelvic abnormalities are identified. No right inguinal mass or hernia. Vascular calcifications are noted. IMPRESSION: 1. No acute right hip fracture or evidence of AVN. 2. Moderate right hip joint degenerative changes. 3. The visualized bony pelvis is intact.  The Electronically Signed   By: Marijo Sanes M.D.   On: 07/02/2016 09:43   US Venous Img Lower Bilateral  Result Date: 07/02/2016 CLINICAL DATA:  Initial evaluation for new lower extremity swelling, right hip pain, recent fall. EXAM: BILATERAL LOWER EXTREMITY VENOUS DOPPLER ULTRASOUND TECHNIQUE: Gray-scale  sonography with graded compression, as well as color Doppler and duplex ultrasound were performed to evaluate the lower extremity deep venous systems from the level of the common femoral vein and including the common femoral, femoral, profunda femoral, popliteal and calf veins including the posterior tibial, peroneal and gastrocnemius veins when visible. The  superficial great saphenous vein was also interrogated. Spectral Doppler was utilized to evaluate flow at rest and with distal augmentation maneuvers in the common femoral, femoral and popliteal veins. COMPARISON:  None. FINDINGS: RIGHT LOWER EXTREMITY Common Femoral Vein: No evidence of thrombus. Normal compressibility, respiratory phasicity and response to augmentation. Saphenofemoral Junction: No evidence of thrombus. Normal compressibility and flow on color Doppler imaging. Profunda Femoral Vein: No evidence of thrombus. Normal compressibility and flow on color Doppler imaging. Femoral Vein: No evidence of thrombus. Normal compressibility, respiratory phasicity and response to augmentation. Popliteal Vein: No evidence of thrombus. Normal compressibility, respiratory phasicity and response to augmentation. Calf Veins: No evidence of thrombus. Normal compressibility and flow on color Doppler imaging. Superficial Great Saphenous Vein: No evidence of thrombus. Normal compressibility and flow on color Doppler imaging. Venous Reflux:  None. Other Findings:  None. LEFT LOWER EXTREMITY Common Femoral Vein: No evidence of thrombus. Normal compressibility, respiratory phasicity and response to augmentation. Saphenofemoral Junction: No evidence of thrombus. Normal compressibility and flow on color Doppler imaging. Profunda Femoral Vein: No evidence of thrombus. Normal compressibility and flow on color Doppler imaging. Femoral Vein: No evidence of thrombus. Normal compressibility, respiratory phasicity and response to augmentation. Popliteal Vein: No evidence of thrombus. Normal compressibility, respiratory phasicity and response to augmentation. Calf Veins: No evidence of thrombus. Normal compressibility and flow on color Doppler imaging. Superficial Great Saphenous Vein: No evidence of thrombus. Normal compressibility and flow on color Doppler imaging. Venous Reflux:  None. Other Findings:  None. IMPRESSION: No evidence  of deep venous thrombosis. Electronically Signed   By: Jeannine Boga M.D.   On: 07/02/2016 00:29   Dg Hip Unilat W Or Wo Pelvis 2-3 Views Right  Result Date: 07/01/2016 CLINICAL DATA:  83 year old female with fall and right hip pain. EXAM: DG HIP (WITH OR WITHOUT PELVIS) 2-3V RIGHT COMPARISON:  None. FINDINGS: There is advanced osteopenia which limits evaluation for fracture. Linear lucency across the right femoral neck is likely artifactual and related to superimposition of the femoral head. A basicervical femoral neck fracture is less likely but not entirely excluded. CT may provide better evaluation if there is high clinical concern for a femoral neck fracture. There is no dislocation. There is moderate bilateral hip osteoarthritic changes. There is degenerative changes of the lower lumbar spine. Multiple surgical clips noted in the lower abdomen. The soft tissues are grossly unremarkable. IMPRESSION: Linear lucency across the femoral neck is likely artifactual. A nondisplaced fracture is less likely. CT may provide better evaluation if there is high clinical concern for a femoral neck fracture. Advanced osteopenia. Electronically Signed   By: Anner Crete M.D.   On: 07/01/2016 23:36    Positive ROS: All other systems have been reviewed and were otherwise negative with the exception of those mentioned in the HPI and as above.  Physical Exam: General: Alert, no acute distress Cardiovascular: No pedal edema Respiratory: No cyanosis, no use of accessory musculature GI: No organomegaly, abdomen is soft and non-tender Skin: No lesions in the area of chief complaint Neurologic: Sensation intact distally Psychiatric: Patient is competent for consent with normal mood and affect Lymphatic: No axillary or cervical lymphadenopathy  MUSCULOSKELETAL: Patient  is sitting up in bed, uncomfortable.  Her son is present.  She has full range of motion of both hips without any evidence of pain.  She has  no tenderness over the trochanteric bursa.  Pelvis is nontender.  Neurovascular status is good distally in both legs.  The back is nontender.  Assessment: Intermittent right hip pain.  Plan: Physical therapy evaluation and ambulation. Consider Mobic 15 mg daily if necessary.    Park Breed, MD [phone]   07/02/2016 12:50 PM

## 2016-07-03 LAB — VITAMIN D 25 HYDROXY (VIT D DEFICIENCY, FRACTURES): Vit D, 25-Hydroxy: 29 ng/mL — ABNORMAL LOW (ref 30.0–100.0)

## 2016-07-03 LAB — HEMOGLOBIN A1C
Hgb A1c MFr Bld: 5.5 % (ref 4.8–5.6)
Mean Plasma Glucose: 111 mg/dL

## 2018-02-10 IMAGING — CT CT HIP*R* W/O CM
2 of 3 series · 17 of 46 positions shown, 19 images · non-contrast
Comparison: Radiographs 07/01/2016

CLINICAL DATA: Fell 1 week ago.  Persistent right hip pain.

EXAM:
CT OF THE RIGHT HIP WITHOUT CONTRAST
TECHNIQUE: Multidetector CT imaging of the right hip was performed according to
the standard protocol. Multiplanar CT image reconstructions were
also generated.

[Series 3: axial st · axial · 0.42mm/px · z∈[-318,-140]mm · 14 of 103 slices shown, 16 images]
[im 7/103  soft-tissue]
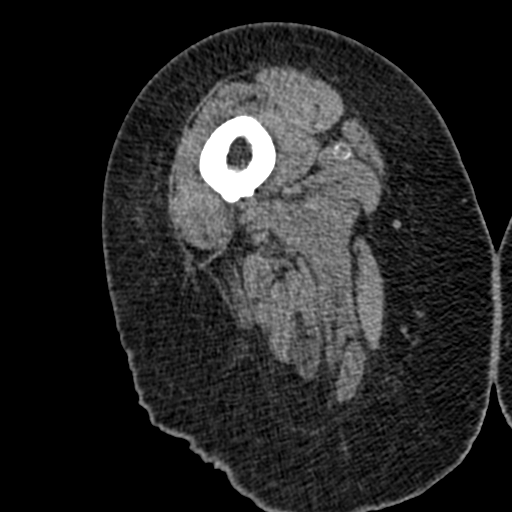
[im 7/103  bone]
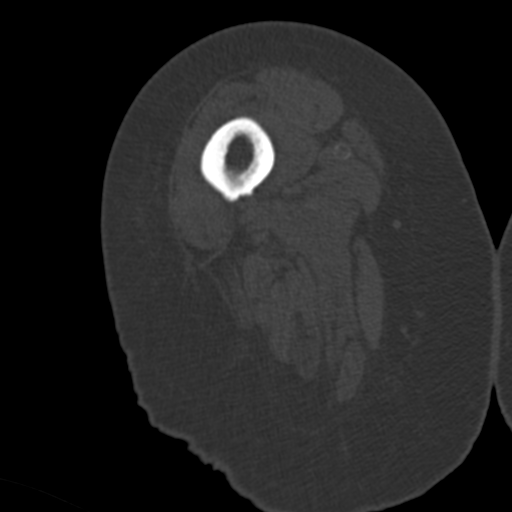
[im 14/103  soft-tissue]
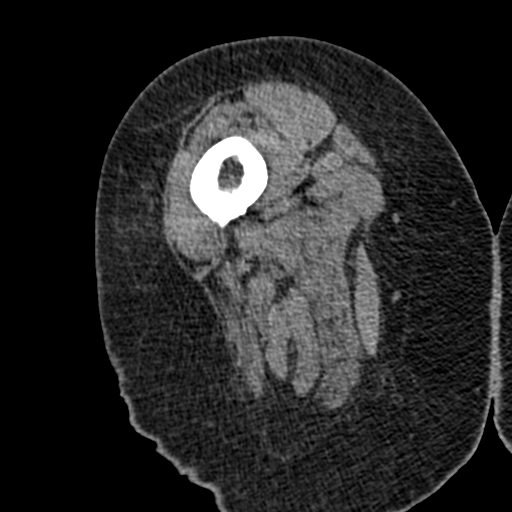
[im 20/103  soft-tissue]
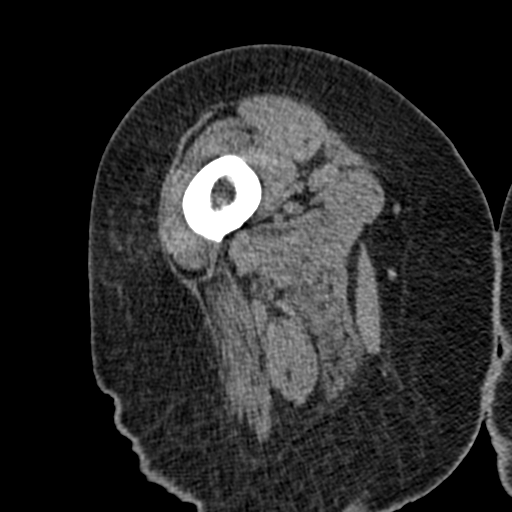
[im 27/103  soft-tissue]
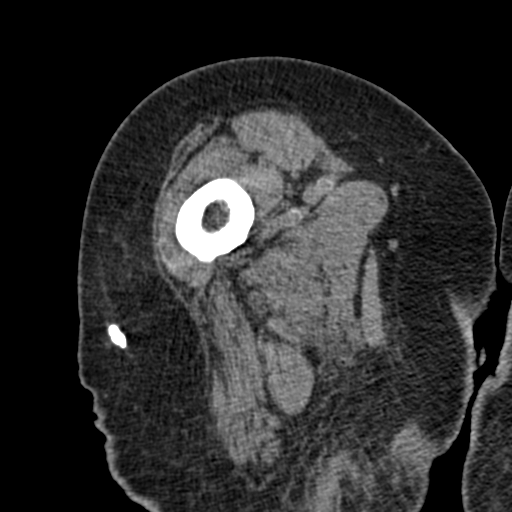
[im 33/103  soft-tissue]
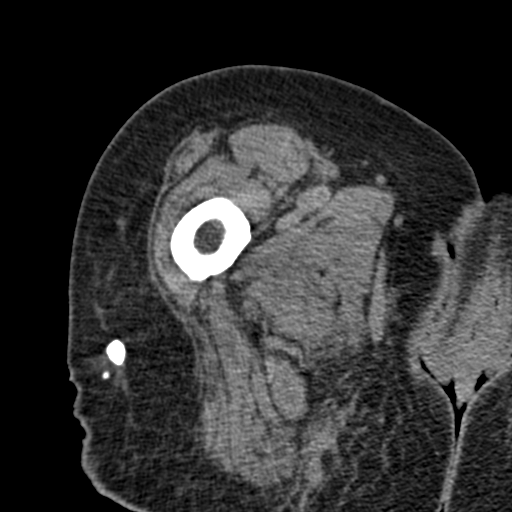
[im 40/103  soft-tissue]
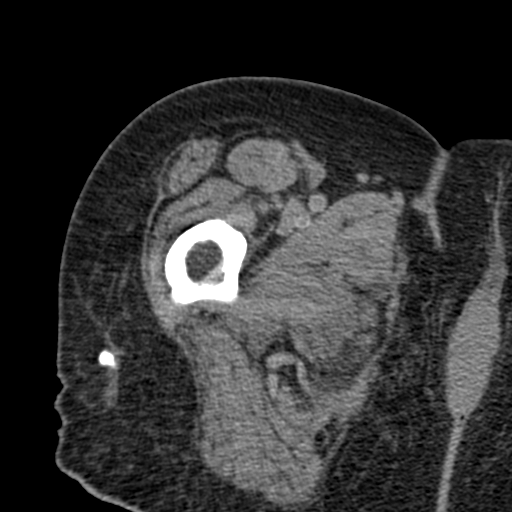
[im 47/103  soft-tissue]
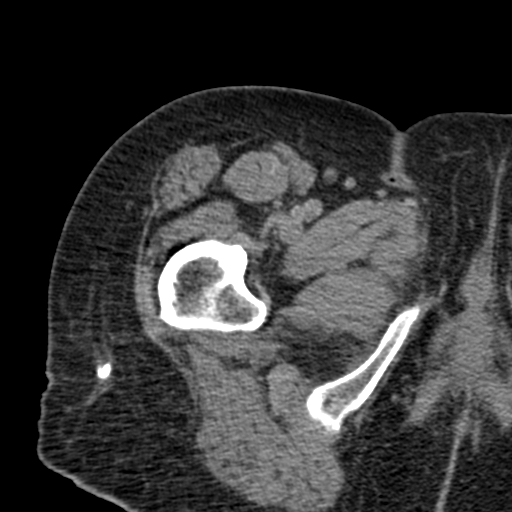
[im 56/103  soft-tissue]
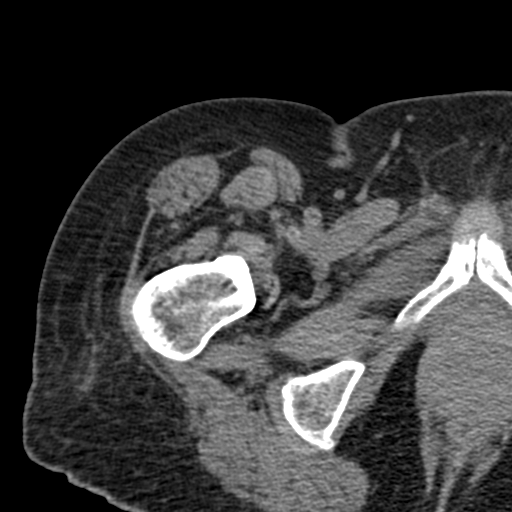
[im 63/103  soft-tissue]
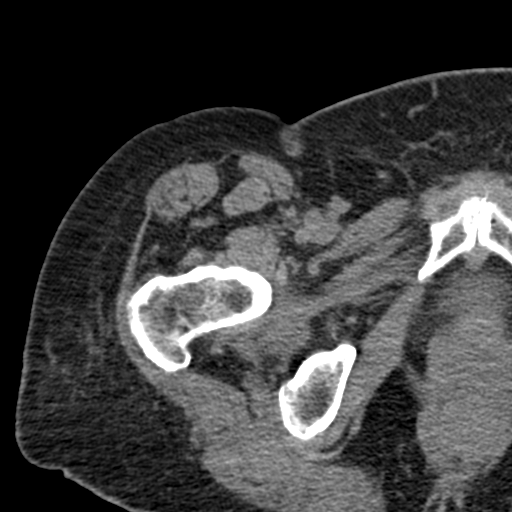
[im 63/103  bone]
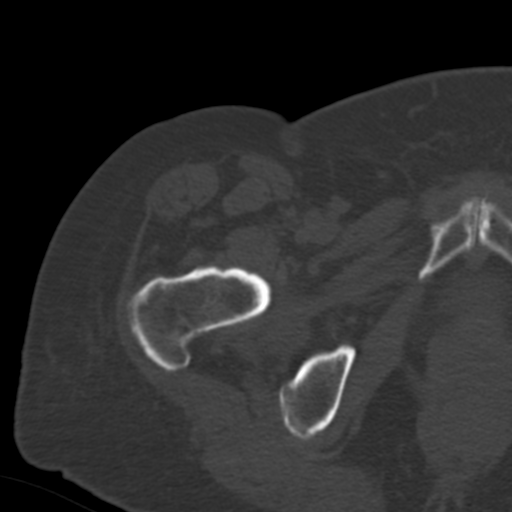
[im 70/103  soft-tissue]
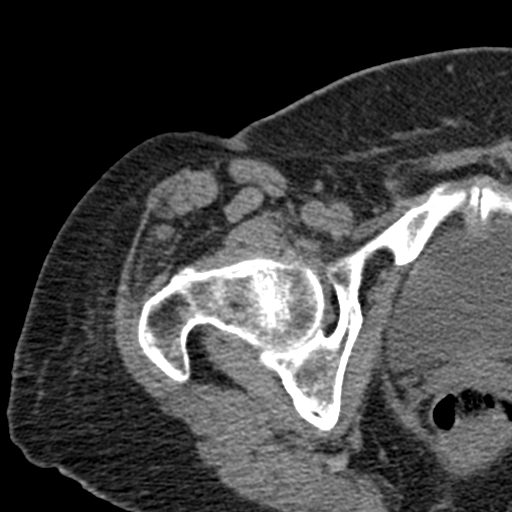
[im 76/103  soft-tissue]
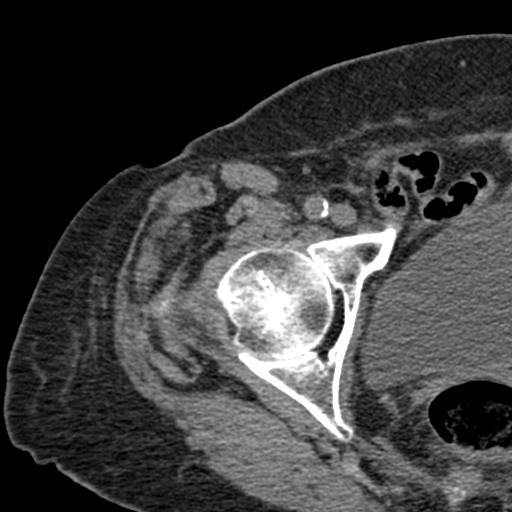
[im 83/103  soft-tissue]
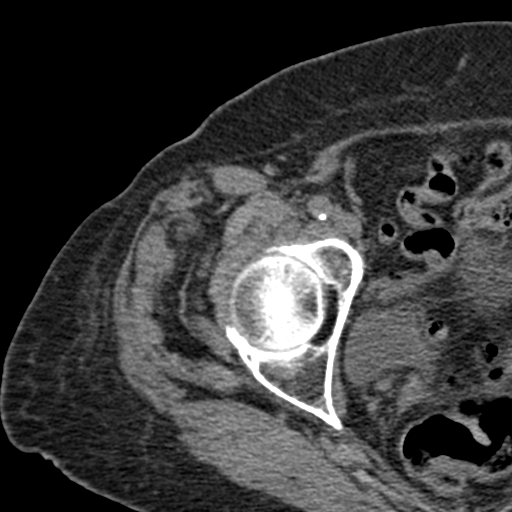
[im 89/103  soft-tissue]
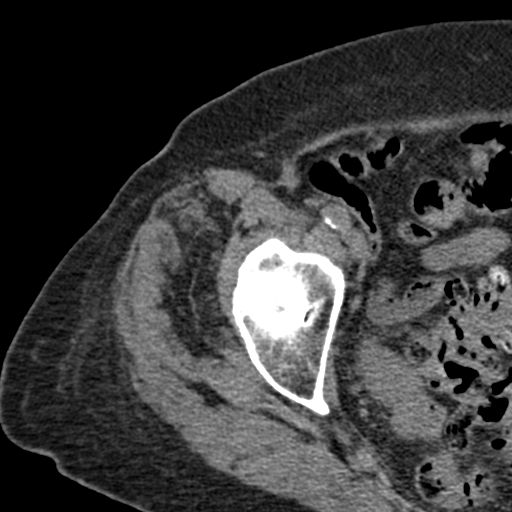
[im 96/103  soft-tissue]
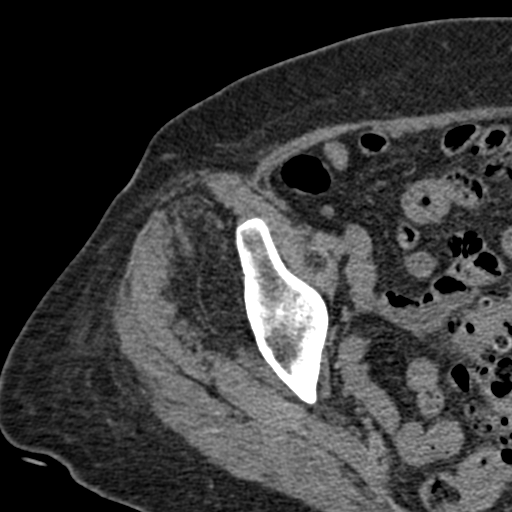

[Series 8: coronal st · coronal · 0.44mm/px · 3 of 80 slices shown]
[im 27/80  soft-tissue]
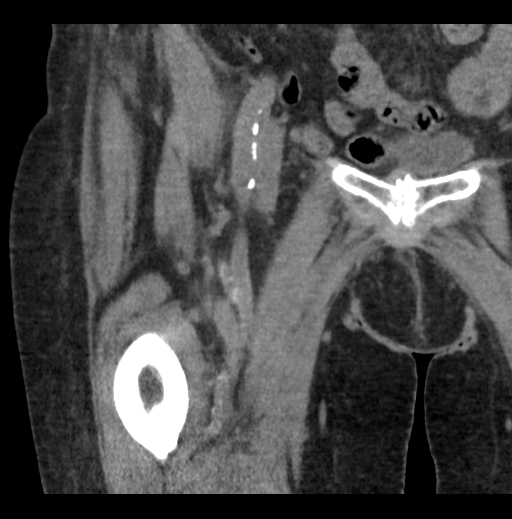
[im 36/80  soft-tissue]
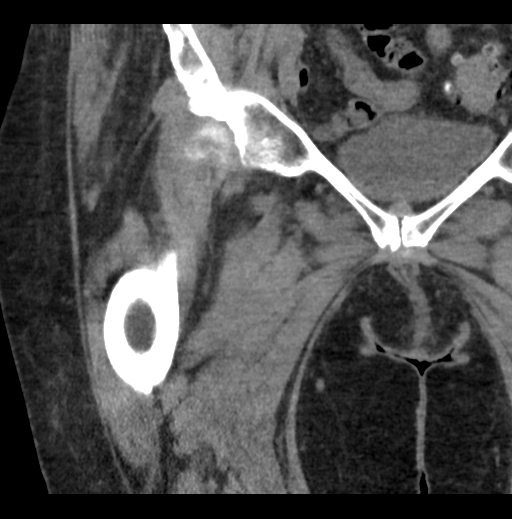
[im 44/80  soft-tissue]
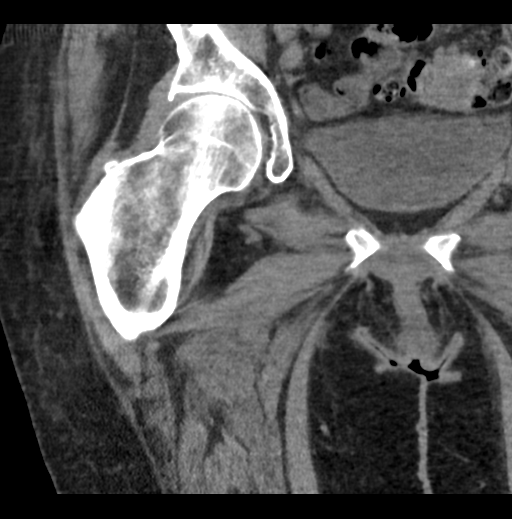

[17 of 46 positions shown; findings below may reference images not displayed]

FINDINGS: The right hip is normally located. Moderate degenerative changes
with joint space narrowing and spurring. No hip fracture. No AVN.
Moderate degenerative changes noted at the pubic symphysis but no
pubic rami fractures.

Moderate fatty atrophy of the gluteus minimus muscle. No muscle
hematoma. Calcification noted in the subcutaneous tissues overlying
the right proximal femur possibly related to remote trauma.

No significant intrapelvic abnormalities are identified. No right
inguinal mass or hernia. Vascular calcifications are noted.
IMPRESSION: 1. No acute right hip fracture or evidence of AVN.
2. Moderate right hip joint degenerative changes.
3. The visualized bony pelvis is intact.  The

## 2018-02-23 ENCOUNTER — Other Ambulatory Visit: Payer: Self-pay | Admitting: Family Medicine

## 2018-02-23 DIAGNOSIS — Z13820 Encounter for screening for osteoporosis: Secondary | ICD-10-CM

## 2019-08-03 ENCOUNTER — Ambulatory Visit: Payer: Self-pay | Admitting: Surgery

## 2019-08-08 ENCOUNTER — Other Ambulatory Visit: Payer: Self-pay

## 2019-08-08 ENCOUNTER — Ambulatory Visit (INDEPENDENT_AMBULATORY_CARE_PROVIDER_SITE_OTHER): Payer: Medicare HMO | Admitting: Surgery

## 2019-08-08 ENCOUNTER — Encounter: Payer: Self-pay | Admitting: Surgery

## 2019-08-08 VITALS — BP 213/80 | HR 77 | Temp 95.2°F | Resp 12 | Ht 65.0 in | Wt 145.6 lb

## 2019-08-08 DIAGNOSIS — K625 Hemorrhage of anus and rectum: Secondary | ICD-10-CM | POA: Diagnosis not present

## 2019-08-08 NOTE — Progress Notes (Signed)
[name], female   DOB: 09-28-1929, 86 y.o.   MRN: [id number]  Chief Complaint: Transient hemorrhoidal bleed.  History of Present Illness Michelle Carney is a 86 y.o. female with an episode where some blood was noted on the commode seat following the bowel movement.  No additional bright red blood per rectum in the subsequent 2 weeks.  She has a prior history of polypectomies.  Has not had a colonoscopy in perhaps a decade or more.  Her stools are soft and she complains of no abdominal pain or anal rectal pain.  Her son is present and she lives with him, due to her dementia he is the primary historian.  Past Medical History Past Medical History:  Diagnosis Date  . Hypertension   . Renal disorder       Past Surgical History:  Procedure Laterality Date  . ABDOMINAL HYSTERECTOMY    . CHOLECYSTECTOMY      No Known Allergies  Current Outpatient Medications  Medication Sig Dispense Refill  . amLODipine (NORVASC) 5 MG tablet     . aspirin 81 MG chewable tablet Chew 81 mg by mouth daily.    Marland Kitchen donepezil (ARICEPT) 10 MG tablet     . famotidine (PEPCID) 20 MG tablet     . lovastatin (MEVACOR) 20 MG tablet Take 20 mg by mouth daily at 6 PM.    . memantine (NAMENDA) 5 MG tablet      No current facility-administered medications for this visit.    Family History Family History  Family history unknown: Yes      Social History Social History   Tobacco Use  . Smoking status: Never Smoker  . Smokeless tobacco: Current User    Types: Snuff  Substance Use Topics  . Alcohol use: No  . Drug use: Not on file        Review of Systems  Unable to perform ROS: Dementia      Physical Exam Blood pressure (!) 213/80, pulse 77, temperature (!) 95.2 F (35.1 C), resp. rate 12, height 5\' 5"  (1.651 m), weight 145 lb 9.6 oz (66 kg), SpO2 92 %. Last Weight  Most recent update: 08/08/2019  2:17 PM   Weight  66 kg (145 lb 9.6 oz)            CONSTITUTIONAL: Well  developed, and nourished, appropriately responsive and aware without distress.   EYES: Sclera non-icteric.   EARS, NOSE, MOUTH AND THROAT: Mask worn.   Hearing is intact to voice.   RESPIRATORY:  Lungs are clear, and breath sounds are equal bilaterally. Normal respiratory effort without pathologic use of accessory muscles. CARDIOVASCULAR: Heart is regular in rate and rhythm. GI: The abdomen is soft, nontender, and nondistended.  GU: No extra anal pathology appreciated, surrounding skin is without induration or fluctuance.  There are no significant external hemorrhoids.  There are no palpable piles, or remarkable hemorrhoids present.  There is no bright red blood on digital examination, with adequate anal tone.  There is soft brown stool present. MUSCULOSKELETAL:  Symmetrical muscle tone appreciated in all four extremities.    SKIN: Skin turgor is normal. No pathologic skin lesions appreciated.  NEUROLOGIC:  Motor and sensation appear grossly normal.  Cranial nerves are grossly without defect. PSYCH:  Alert and oriented to person, place and time. Affect is appropriate for situation.  Data Reviewed I have personally reviewed what is currently available of the patient's imaging, recent labs and medical records.   Labs:  CBC Latest Ref Rng & Units 07/02/2016 07/01/2016 01/09/2015  WBC 3.6 - 11.0 K/uL 5.7 6.8 11.1(H)  Hemoglobin 12.0 - 16.0 g/dL 11.9(L) 12.2 13.5  Hematocrit 35.0 - 47.0 % 35.7 36.0 40.4  Platelets 150 - 440 K/uL 187 247 281   CMP Latest Ref Rng & Units 07/02/2016 07/02/2016 07/01/2016  Glucose 65 - 99 mg/dL 102(H) - 119(H)  BUN 6 - 20 mg/dL 20 - 24(H)  Creatinine 0.44 - 1.00 mg/dL 1.41(H) 1.45(H) 1.83(H)  Sodium 135 - 145 mmol/L 139 - 138  Potassium 3.5 - 5.1 mmol/L 3.7 - 3.3(L)  Chloride 101 - 111 mmol/L 110 - 107  CO2 22 - 32 mmol/L 24 - 24  Calcium 8.9 - 10.3 mg/dL 8.7(L) - 8.9  Total Protein 6.4 - 8.2 g/dL - - -  Total Bilirubin 0.2 - 1.0 mg/dL - - -  Alkaline Phos 50 - 136  Unit/L - - -  AST 15 - 37 Unit/L - - -  ALT U/L - - -       Assessment    Probable transient hemorrhoidal bleed, spontaneously resolved and not recurring.  No evidence of anemia or ongoing chronic lower GI hemorrhage to suspect further pathology to warrant additional colonoscopy, though referral for consultation was offered. Patient Active Problem List   Diagnosis Date Noted  . Syncope 07/02/2016    Plan    As there are no remarkable hemorrhoids or symptomatic hemorrhoids present and the source of the bleeding is unconfirmed, we will not pursue any surgical intervention along this line.  Face-to-face time spent with the patient and accompanying care providers(if present) was 30 minutes, with more than 50% of the time spent counseling, educating, and coordinating care of the patient.      Ronny Bacon M.D., FACS 08/08/2019, 3:14 PM

## 2019-08-08 NOTE — Patient Instructions (Addendum)
Continue to drink plenty of fluids and eat foods with fiber.   Follow up as needed. Call the office if you have any questions or concerns.   Hemorrhoids Hemorrhoids are swollen veins in and around the rectum or anus. There are two types of hemorrhoids:  Internal hemorrhoids. These occur in the veins that are just inside the rectum. They may poke through to the outside and become irritated and painful.  External hemorrhoids. These occur in the veins that are outside the anus and can be felt as a painful swelling or hard lump near the anus. Most hemorrhoids do not cause serious problems, and they can be managed with home treatments such as diet and lifestyle changes. If home treatments do not help the symptoms, procedures can be done to shrink or remove the hemorrhoids. What are the causes? This condition is caused by increased pressure in the anal area. This pressure may result from various things, including:  Constipation.  Straining to have a bowel movement.  Diarrhea.  Pregnancy.  Obesity.  Sitting for long periods of time.  Heavy lifting or other activity that causes you to strain.  Anal sex.  Riding a bike for a long period of time. What are the signs or symptoms? Symptoms of this condition include:  Pain.  Anal itching or irritation.  Rectal bleeding.  Leakage of stool (feces).  Anal swelling.  One or more lumps around the anus. How is this diagnosed? This condition can often be diagnosed through a visual exam. Other exams or tests may also be done, such as:  An exam that involves feeling the rectal area with a gloved hand (digital rectal exam).  An exam of the anal canal that is done using a small tube (anoscope).  A blood test, if you have lost a significant amount of blood.  A test to look inside the colon using a flexible tube with a camera on the end (sigmoidoscopy or colonoscopy). How is this treated? This condition can usually be treated at home.  However, various procedures may be done if dietary changes, lifestyle changes, and other home treatments do not help your symptoms. These procedures can help make the hemorrhoids smaller or remove them completely. Some of these procedures involve surgery, and others do not. Common procedures include:  Rubber band ligation. Rubber bands are placed at the base of the hemorrhoids to cut off their blood supply.  Sclerotherapy. Medicine is injected into the hemorrhoids to shrink them.  Infrared coagulation. A type of light energy is used to get rid of the hemorrhoids.  Hemorrhoidectomy surgery. The hemorrhoids are surgically removed, and the veins that supply them are tied off.  Stapled hemorrhoidopexy surgery. The surgeon staples the base of the hemorrhoid to the rectal wall. Follow these instructions at home: Eating and drinking   Eat foods that have a lot of fiber in them, such as whole grains, beans, nuts, fruits, and vegetables.  Ask your health care provider about taking products that have added fiber (fiber supplements).  Reduce the amount of fat in your diet. You can do this by eating low-fat dairy products, eating less red meat, and avoiding processed foods.  Drink enough fluid to keep your urine pale yellow. Managing pain and swelling   Take warm sitz baths for 20 minutes, 3-4 times a day to ease pain and discomfort. You may do this in a bathtub or using a portable sitz bath that fits over the toilet.  If directed, apply ice to the affected  area. Using ice packs between sitz baths may be helpful. ? Put ice in a plastic bag. ? Place a towel between your skin and the bag. ? Leave the ice on for 20 minutes, 2-3 times a day. General instructions  Take over-the-counter and prescription medicines only as told by your health care provider.  Use medicated creams or suppositories as told.  Get regular exercise. Ask your health care provider how much and what kind of exercise is best  for you. In general, you should do moderate exercise for at least 30 minutes on most days of the week (150 minutes each week). This can include activities such as walking, biking, or yoga.  Go to the bathroom when you have the urge to have a bowel movement. Do not wait.  Avoid straining to have bowel movements.  Keep the anal area dry and clean. Use wet toilet paper or moist towelettes after a bowel movement.  Do not sit on the toilet for long periods of time. This increases blood pooling and pain.  Keep all follow-up visits as told by your health care provider. This is important. Contact a health care provider if you have:  Increasing pain and swelling that are not controlled by treatment or medicine.  Difficulty having a bowel movement, or you are unable to have a bowel movement.  Pain or inflammation outside the area of the hemorrhoids. Get help right away if you have:  Uncontrolled bleeding from your rectum. Summary  Hemorrhoids are swollen veins in and around the rectum or anus.  Most hemorrhoids can be managed with home treatments such as diet and lifestyle changes.  Taking warm sitz baths can help ease pain and discomfort.  In severe cases, procedures or surgery can be done to shrink or remove the hemorrhoids. This information is not intended to replace advice given to you by your health care provider. Make sure you discuss any questions you have with your health care provider. Document Revised: 08/26/2018 Document Reviewed: 08/19/2017 Elsevier Patient Education  [place].

## 2020-04-18 ENCOUNTER — Emergency Department: Payer: Medicare HMO

## 2020-04-18 ENCOUNTER — Encounter: Payer: Self-pay | Admitting: Emergency Medicine

## 2020-04-18 ENCOUNTER — Emergency Department
Admission: EM | Admit: 2020-04-18 | Discharge: 2020-04-18 | Disposition: A | Discharge status: Home / Self Care | Payer: Medicare HMO | Attending: Emergency Medicine | Admitting: Emergency Medicine

## 2020-04-18 ENCOUNTER — Other Ambulatory Visit: Payer: Self-pay

## 2020-04-18 DIAGNOSIS — F0390 Unspecified dementia without behavioral disturbance: Secondary | ICD-10-CM | POA: Insufficient documentation

## 2020-04-18 DIAGNOSIS — Z79899 Other long term (current) drug therapy: Secondary | ICD-10-CM | POA: Insufficient documentation

## 2020-04-18 DIAGNOSIS — I10 Essential (primary) hypertension: Secondary | ICD-10-CM | POA: Diagnosis not present

## 2020-04-18 DIAGNOSIS — U071 COVID-19: Secondary | ICD-10-CM | POA: Diagnosis not present

## 2020-04-18 DIAGNOSIS — Z7982 Long term (current) use of aspirin: Secondary | ICD-10-CM | POA: Insufficient documentation

## 2020-04-18 DIAGNOSIS — R531 Weakness: Secondary | ICD-10-CM | POA: Diagnosis present

## 2020-04-18 DIAGNOSIS — E860 Dehydration: Secondary | ICD-10-CM | POA: Insufficient documentation

## 2020-04-18 LAB — CBC
HCT: 39.4 % (ref 36.0–46.0)
Hemoglobin: 12.9 g/dL (ref 12.0–15.0)
MCH: 29 pg (ref 26.0–34.0)
MCHC: 32.7 g/dL (ref 30.0–36.0)
MCV: 88.5 fL (ref 80.0–100.0)
Platelets: 264 10*3/uL (ref 150–400)
RBC: 4.45 MIL/uL (ref 3.87–5.11)
RDW: 14.1 % (ref 11.5–15.5)
WBC: 8.3 10*3/uL (ref 4.0–10.5)
nRBC: 0 % (ref 0.0–0.2)

## 2020-04-18 LAB — BASIC METABOLIC PANEL
Anion gap: 10 (ref 5–15)
BUN: 38 mg/dL — ABNORMAL HIGH (ref 8–23)
CO2: 19 mmol/L — ABNORMAL LOW (ref 22–32)
Calcium: 9 mg/dL (ref 8.9–10.3)
Chloride: 108 mmol/L (ref 98–111)
Creatinine, Ser: 2.25 mg/dL — ABNORMAL HIGH (ref 0.44–1.00)
GFR, Estimated: 20 mL/min — ABNORMAL LOW (ref >60)
Glucose, Bld: 101 mg/dL — ABNORMAL HIGH (ref 70–99)
Potassium: 4.7 mmol/L (ref 3.5–5.1)
Sodium: 137 mmol/L (ref 135–145)

## 2020-04-18 MED ORDER — SODIUM CHLORIDE 0.9 % IV BOLUS
1000.0000 mL | Freq: Once | INTRAVENOUS | Status: AC
  Administered 2020-04-18: 1000 mL via INTRAVENOUS

## 2020-04-18 NOTE — ED Triage Notes (Signed)
Pt to ED via POV with c/o increasing weakness. Per son pt was referred to ED for possible fluids due to dehydration. Pt's son states pt was dx with covid yesterday at PCP office. Pt with noted cough in triage.

## 2020-04-18 NOTE — Discharge Instructions (Addendum)
Return to the ER for new, worsening, or persistent weakness, any confusion or change in mental status, vomiting or inability to eat, high fever, shortness of breath, or any other new or worsening symptoms that concern you.  Follow-up with the primary care doctor in the next few weeks to recheck the kidney function and make sure it is at baseline.

## 2020-04-18 NOTE — ED Provider Notes (Signed)
[facility] Emergency Department Provider Note ____________________________________________   Event Date/Time   First MD Initiated Contact with Patient 04/18/20 2129     (approximate)  I have reviewed the triage vital signs and the nursing notes.   HISTORY  Chief Complaint Weakness and Covid Positive  Level 5 caveat: History of present illness limited due to dementia  HPI Michelle Carney is a 87 y.o. female with PMH as noted below including hypertension and chronic kidney disease, as well as a history of dementia who presents with concern for dehydration and increased weakness after being diagnosed with COVID yesterday.  The son states that the patient has had a gradual onset of weakness over the last few days and has seemed a bit more tired than normal, sleeping a lot.  However she has not been altered or confused compared to baseline.  He states that they went to the doctor yesterday and she tested positive for COVID.  The doctor told him to come to the hospital to get fluids, because the patient seemed dehydrated.  Per the son, the patient has not had any cough or shortness of breath.  She does have a good appetite.  Past Medical History:  Diagnosis Date  . Hypertension   . Renal disorder     Patient Active Problem List   Diagnosis Date Noted  . Syncope 07/02/2016    Past Surgical History:  Procedure Laterality Date  . ABDOMINAL HYSTERECTOMY    . CHOLECYSTECTOMY      Prior to Admission medications   Medication Sig Start Date End Date Taking? Authorizing Provider  amLODipine (NORVASC) 5 MG tablet  06/20/19   [provider]  aspirin 81 MG chewable tablet Chew 81 mg by mouth daily.    [provider]  donepezil (ARICEPT) 10 MG tablet  06/14/19   [provider]  famotidine (PEPCID) 20 MG tablet  07/25/19   [provider]  lovastatin (MEVACOR) 20 MG tablet Take 20 mg by mouth daily at 6 PM.    [provider]  memantine (NAMENDA) 5 MG tablet  07/18/19   [provider]    Allergies Patient has no known allergies.  Family History  Family history unknown: Yes    Social History Social History   Tobacco Use  . Smoking status: Never Smoker  . Smokeless tobacco: Current User    Types: Snuff  Substance Use Topics  . Alcohol use: No    Review of Systems Level 5 COVID: Review of systems limited due to dementia Constitutional: No fever. Respiratory: Denies shortness of breath. Gastrointestinal: No vomiting or diarrhea.  Skin: Negative for rash. Neurological: Negative for altered mental status.   ____________________________________________   PHYSICAL EXAM:  VITAL SIGNS: ED Triage Vitals  Enc Vitals Group     BP 04/18/20 1400 (!) 102/38     Pulse Rate 04/18/20 1400 (!) 111     Resp 04/18/20 1400 (!) 24     Temp 04/18/20 1400 98.7 F (37.1 C)     Temp Source 04/18/20 1400 Oral     SpO2 04/18/20 1400 (!) 81 %     Weight 04/18/20 1425 125 lb (56.7 kg)     Height 04/18/20 1425 5\' 5"  (1.651 m)     Head Circumference --      Peak Flow --      Pain Score 04/18/20 1425 0     Pain Loc --      Pain Edu? --  Excl. in [place]? --     Constitutional: Alert, oriented x2. Well appearing for age, in no acute distress. Eyes: Conjunctivae are normal.  EOMI. Head: Atraumatic. Nose: No congestion/rhinnorhea. Mouth/Throat: Mucous membranes are slightly dry.   Neck: Normal range of motion.  Cardiovascular: Normal rate, regular rhythm. Grossly normal heart sounds.  Good peripheral circulation. Respiratory: Normal respiratory effort.  No retractions. Lungs CTAB. Gastrointestinal: Soft and nontender. No distention.  Genitourinary: No flank tenderness. Musculoskeletal: No lower extremity edema.  Extremities warm and well perfused.  Neurologic:  Normal speech and language.  Motor intact in all extremities Skin:  Skin is warm and dry. No rash noted. Psychiatric: Calm and  cooperative.  ____________________________________________   LABS (all labs ordered are listed, but only abnormal results are displayed)  Labs Reviewed  BASIC METABOLIC PANEL - Abnormal; Notable for the following components:      Result Value   CO2 19 (*)    Glucose, Bld 101 (*)    BUN 38 (*)    Creatinine, Ser 2.25 (*)    GFR, Estimated 20 (*)    All other components within normal limits  CBC  URINALYSIS, COMPLETE (UACMP) WITH MICROSCOPIC  CBG MONITORING, ED   ____________________________________________  EKG  ED ECG REPORT I, Arta Silence, the attending physician, personally viewed and interpreted this ECG.  Date: 04/18/2020 EKG Time: 1422 Rate: 63 Rhythm: normal sinus rhythm QRS Axis: Left axis Intervals: Incomplete RBBB ST/T Wave abnormalities: normal Narrative Interpretation: no evidence of acute ischemia  ____________________________________________  RADIOLOGY  Chest x-ray interpreted by me shows no focal infiltrate or edema  ____________________________________________   PROCEDURES  Procedure(s) performed: No  Procedures  Critical Care performed: No ____________________________________________   INITIAL IMPRESSION / ASSESSMENT AND PLAN / ED COURSE  Pertinent labs & imaging results that were available during my care of the patient were reviewed by me and considered in my medical decision making (see chart for details).  87 year old female with PMH as noted above including hypertension and chronic kidney disease presents with some increased generalized weakness and concern for possible dehydration after being diagnosed with COVID-19 yesterday.  The son states the main reason they came to the hospital is because the doctor she saw yesterday told them to come for fluids.  I reviewed the past medical records in [place].  The patient was most recently admitted in 2018 with a fall and elevated troponin.  On exam currently she is  well-appearing for her age.  Her vital signs are normal.  She is alert and oriented x2, and the son states she is at her baseline mental status.  O2 saturation is 100% on room air.  She has no increased work of breathing or respiratory distress.  Neuro exam is nonfocal.  Overall presentation is consistent with symptoms related to COVID-19.  The patient is possibly mildly dehydrated.  Chest x-ray shows no infiltrates or other acute abnormality, and the EKG shows no ischemic changes.  Initial labs obtained from triage show an elevated creatinine but are otherwise unremarkable.  Creatinine is currently 2.2.  Unfortunately I do not have any recent prior records to compare this to and the son does not know the baseline creatinine.  At the end of her last hospitalization in 2018 the creatinine was 1.4, although given the son's report of chronic kidney disease I suspect that the 2.25 is not much above her baseline.  Even if the patient has a mild elevation in creatinine, she is tolerating p.o., has  normal vital signs, and appears well, so I do not think she would benefit from admission.  The son states he would strongly prefer to take her home if at all possible.  We will give a liter of normal saline.  At this time, there is no indication for additional work-up.  The patient has already been in the ED waiting more than 8 hours.  I anticipate discharge home.  ----------------------------------------- 11:35 PM on 04/18/2020 -----------------------------------------  The patient continues to appear comfortable.  She is still receiving some of the fluid.  She and the son are comfortable going home.  I recommended to follow-up with the PMD within the next 1 to 2 weeks to recheck the creatinine.  I gave thorough return precautions, and the son expressed understanding. ____________________________________________   FINAL CLINICAL IMPRESSION(S) / ED DIAGNOSES  Final diagnoses:  Dehydration  COVID       NEW MEDICATIONS STARTED DURING THIS VISIT:  New Prescriptions   No medications on file     Note:  This document was prepared using Dragon voice recognition software and may include unintentional dictation errors.   Arta Silence, MD 04/18/20 [phone]

## 2020-04-21 ENCOUNTER — Other Ambulatory Visit: Payer: Self-pay

## 2020-04-21 ENCOUNTER — Encounter: Payer: Self-pay | Admitting: Emergency Medicine

## 2020-04-21 ENCOUNTER — Emergency Department: Payer: Medicare HMO

## 2020-04-21 ENCOUNTER — Emergency Department
Admission: EM | Admit: 2020-04-21 | Discharge: 2020-04-22 | Disposition: A | Discharge status: Home / Self Care | Payer: Medicare HMO | Source: Home / Self Care | Attending: Emergency Medicine | Admitting: Emergency Medicine

## 2020-04-21 DIAGNOSIS — Z7982 Long term (current) use of aspirin: Secondary | ICD-10-CM | POA: Insufficient documentation

## 2020-04-21 DIAGNOSIS — F0390 Unspecified dementia without behavioral disturbance: Secondary | ICD-10-CM

## 2020-04-21 DIAGNOSIS — U071 COVID-19: Secondary | ICD-10-CM

## 2020-04-21 DIAGNOSIS — N179 Acute kidney failure, unspecified: Secondary | ICD-10-CM | POA: Diagnosis not present

## 2020-04-21 DIAGNOSIS — Z79899 Other long term (current) drug therapy: Secondary | ICD-10-CM | POA: Insufficient documentation

## 2020-04-21 DIAGNOSIS — I10 Essential (primary) hypertension: Secondary | ICD-10-CM | POA: Insufficient documentation

## 2020-04-21 DIAGNOSIS — R5383 Other fatigue: Secondary | ICD-10-CM

## 2020-04-21 DIAGNOSIS — R0602 Shortness of breath: Secondary | ICD-10-CM

## 2020-04-21 LAB — URINALYSIS, COMPLETE (UACMP) WITH MICROSCOPIC
Bilirubin Urine: NEGATIVE
Glucose, UA: NEGATIVE mg/dL
Ketones, ur: NEGATIVE mg/dL
Nitrite: NEGATIVE
Protein, ur: 100 mg/dL — AB
Specific Gravity, Urine: 1.016 (ref 1.005–1.030)
pH: 5 (ref 5.0–8.0)

## 2020-04-21 LAB — BASIC METABOLIC PANEL
Anion gap: 9 (ref 5–15)
BUN: 38 mg/dL — ABNORMAL HIGH (ref 8–23)
CO2: 19 mmol/L — ABNORMAL LOW (ref 22–32)
Calcium: 8.7 mg/dL — ABNORMAL LOW (ref 8.9–10.3)
Chloride: 111 mmol/L (ref 98–111)
Creatinine, Ser: 2.1 mg/dL — ABNORMAL HIGH (ref 0.44–1.00)
GFR, Estimated: 22 mL/min — ABNORMAL LOW (ref >60)
Glucose, Bld: 111 mg/dL — ABNORMAL HIGH (ref 70–99)
Potassium: 4.5 mmol/L (ref 3.5–5.1)
Sodium: 139 mmol/L (ref 135–145)

## 2020-04-21 LAB — CBC
HCT: 39.9 % (ref 36.0–46.0)
Hemoglobin: 12.8 g/dL (ref 12.0–15.0)
MCH: 28.5 pg (ref 26.0–34.0)
MCHC: 32.1 g/dL (ref 30.0–36.0)
MCV: 88.9 fL (ref 80.0–100.0)
Platelets: 259 10*3/uL (ref 150–400)
RBC: 4.49 MIL/uL (ref 3.87–5.11)
RDW: 14.2 % (ref 11.5–15.5)
WBC: 5.1 10*3/uL (ref 4.0–10.5)
nRBC: 0 % (ref 0.0–0.2)

## 2020-04-21 MED ORDER — SODIUM CHLORIDE 0.9 % IV BOLUS
1000.0000 mL | Freq: Once | INTRAVENOUS | Status: AC
  Administered 2020-04-21: 1000 mL via INTRAVENOUS

## 2020-04-21 NOTE — ED Provider Notes (Signed)
[facility] Emergency Department Provider Note  ____________________________________________  Time seen: Approximately 6:33 PM  I have reviewed the triage vital signs and the nursing notes.   HISTORY  Chief Complaint Fatigue  Level 5 caveat: History provided primarily by patient's son  HPI Michelle Carney is a 87 y.o. female who presents the emergency department with her son for complaint of possible breathing difficulties in the setting of a positive Covid test.  Patient presents with her son who states that she had some deep sighing type respirations earlier today.  Son states that this is not atypical for the patient but she was recently diagnosed with Covid and he wanted to ensure that she was not having any respiratory difficulty.  Patient has not had any cough, reported shortness of breath other than these 3 respirations, nasal congestion, sore throat.  Her appetite is still well and she is eating and drinking appropriately.  Patient has no other complaints at this time.         Past Medical History:  Diagnosis Date  . Hypertension   . Renal disorder     Patient Active Problem List   Diagnosis Date Noted  . Syncope 07/02/2016    Past Surgical History:  Procedure Laterality Date  . ABDOMINAL HYSTERECTOMY    . CHOLECYSTECTOMY      Prior to Admission medications   Medication Sig Start Date End Date Taking? Authorizing Provider  amLODipine (NORVASC) 5 MG tablet  06/20/19   [provider]  aspirin 81 MG chewable tablet Chew 81 mg by mouth daily.    [provider]  donepezil (ARICEPT) 10 MG tablet  06/14/19   [provider]  famotidine (PEPCID) 20 MG tablet  07/25/19   [provider]  lovastatin (MEVACOR) 20 MG tablet Take 20 mg by mouth daily at 6 PM.    [provider]  memantine (NAMENDA) 5 MG tablet  07/18/19   [provider]    Allergies Patient has no known allergies.  Family History   Family history unknown: Yes    Social History Social History   Tobacco Use  . Smoking status: Never Smoker  . Smokeless tobacco: Current User    Types: Snuff  Substance Use Topics  . Alcohol use: No     Review of Systems  Constitutional: No fever/chills Eyes: No visual changes. No discharge ENT: No upper respiratory complaints. Cardiovascular: no chest pain. Respiratory: no cough. No increased work of breathing/SOB.  Patient had deep "sighing" respirations Gastrointestinal: No abdominal pain.  No nausea, no vomiting.  No diarrhea.  No constipation. Genitourinary: Negative for dysuria. No hematuria Musculoskeletal: Negative for musculoskeletal pain. Skin: Negative for rash, abrasions, lacerations, ecchymosis. Neurological: Negative for headaches, focal weakness or numbness.  10 System ROS otherwise negative.  ____________________________________________   PHYSICAL EXAM:  VITAL SIGNS: ED Triage Vitals  Enc Vitals Group     BP 04/21/20 1529 (!) 99/57     Pulse Rate 04/21/20 1529 62     Resp 04/21/20 1529 20     Temp 04/21/20 1529 (!) 97.4 F (36.3 C)     Temp Source 04/21/20 1529 Oral     SpO2 04/21/20 1529 100 %     Weight 04/21/20 1524 125 lb (56.7 kg)     Height 04/21/20 1524 5\' 5"  (1.651 m)     Head Circumference --      Peak Flow --      Pain Score --  Pain Loc --      Pain Edu? --      Excl. in [place]? --      Constitutional: Alert and oriented.  The patient will respond to direct questioning, however does not provide any additional information and prefers son to answer questions.  Well appearing and in no acute distress. Eyes: Conjunctivae are normal. PERRL. EOMI. Head: Atraumatic. ENT:      Ears:       Nose: No congestion/rhinnorhea.      Mouth/Throat: Mucous membranes are moist.  Neck: No stridor.  Neck is supple full range of motion Hematological/Lymphatic/Immunilogical: No cervical lymphadenopathy. Cardiovascular: Normal rate, regular rhythm.  Normal S1 and S2.  Good peripheral circulation. Respiratory: Normal respiratory effort without tachypnea or retractions. Lungs CTAB with no wheezing, rales or rhonchi. Good air entry to the bases with no decreased or absent breath sounds. Musculoskeletal: Full range of motion to all extremities. No gross deformities appreciated. Neurologic:  Normal speech and language. No gross focal neurologic deficits are appreciated.  Skin:  Skin is warm, dry and intact. No rash noted. Psychiatric: Mood and affect are normal. Speech and behavior are normal. Patient exhibits appropriate insight and judgement.   ____________________________________________   LABS (all labs ordered are listed, but only abnormal results are displayed)  Labs Reviewed  BASIC METABOLIC PANEL - Abnormal; Notable for the following components:      Result Value   CO2 19 (*)    Glucose, Bld 111 (*)    BUN 38 (*)    Creatinine, Ser 2.10 (*)    Calcium 8.7 (*)    GFR, Estimated 22 (*)    All other components within normal limits  CBC  URINALYSIS, COMPLETE (UACMP) WITH MICROSCOPIC   ____________________________________________  EKG   ____________________________________________  RADIOLOGY I personally viewed and evaluated these images as part of my medical decision making, as well as reviewing the written report by the radiologist.  ED Provider Interpretation: Concur with radiologist finding of no acute cardiopulmonary abnormality and no consolidation specifically  DG Chest 2 View  Result Date: 04/21/2020 CLINICAL DATA:  Shortness of breath.  COVID-19 virus infection. EXAM: CHEST - 2 VIEW COMPARISON:  04/18/2020 FINDINGS: The heart size and mediastinal contours are within normal limits. Aortic atherosclerotic calcification noted. Patient is partially rotated to the left. Both lungs are clear. The visualized skeletal structures are unremarkable. IMPRESSION: No active cardiopulmonary disease. Electronically Signed   By:  Marlaine Hind M.D.   On: 04/21/2020 16:42    ____________________________________________    PROCEDURES  Procedure(s) performed:    Procedures    Medications  sodium chloride 0.9 % bolus 1,000 mL (1,000 mLs Intravenous New Bag/Given 04/21/20 2057)     ____________________________________________   INITIAL IMPRESSION / ASSESSMENT AND PLAN / ED COURSE  Pertinent labs & imaging results that were available during my care of the patient were reviewed by me and considered in my medical decision making (see chart for details).  Review of the [place] CSRS was performed in accordance of the [place] prior to dispensing any controlled drugs.           Patient's diagnosis is consistent with COVID-19.  Patient presented to the emergency department with her son for evaluation of her breathing.  Patient has been diagnosed with Covid, was seen in this department 2 days ago and had some mild dehydration symptoms.  Patient is still eating and drinking appropriately.  Vital signs and labs are stable.  Patient had "sighing" respirations at  home but there is no continuation of these respirations here in the emergency department with no increased work of breathing.  Lung sounds are reassuring. At this time waiting for urinalysis for the patient. Patient was endorsing that she had to urinate, has not produced a urine sample at this time. In-N-Out cath was attempted but the patient became combative and stated that she would not allow in and out cath. Currently given the patient IV fluids and will await urinalysis. Pending urinalysis return patient will be handed over to Dr. Joni Fears. Dr. Joni Fears will provide final diagnosis and disposition         This chart was dictated using voice recognition software/Dragon. Despite best efforts to proofread, errors can occur which can change the meaning. Any change was purely unintentional.    Brynda Peon 04/21/20 2131    Carrie Mew,  MD 04/21/20 [phone]

## 2020-04-21 NOTE — ED Triage Notes (Signed)
Pt to ED via POV with son, pt dx with covid on 1/5, pt's son states concerns regarding the way patient is breathing, pt is sitting in chair with eyes closed. Pt visualized in NAD at this time. Pt's son reports that patient has been laying in the bed since discharge.

## 2020-04-21 NOTE — ED Notes (Signed)
This RN and [facility] representative attempted in and out cath x1 with no success due to pt being combative. Provider made aware.

## 2020-04-25 ENCOUNTER — Emergency Department: Payer: Medicare HMO

## 2020-04-25 ENCOUNTER — Inpatient Hospital Stay
Admission: EM | Admit: 2020-04-25 | Discharge: 2020-04-30 | DRG: 682 | Disposition: A | Discharge status: Hospice | Payer: Medicare HMO | Attending: Internal Medicine | Admitting: Internal Medicine

## 2020-04-25 ENCOUNTER — Other Ambulatory Visit: Payer: Self-pay

## 2020-04-25 DIAGNOSIS — G9340 Encephalopathy, unspecified: Secondary | ICD-10-CM | POA: Diagnosis present

## 2020-04-25 DIAGNOSIS — I10 Essential (primary) hypertension: Secondary | ICD-10-CM

## 2020-04-25 DIAGNOSIS — G309 Alzheimer's disease, unspecified: Secondary | ICD-10-CM | POA: Diagnosis not present

## 2020-04-25 DIAGNOSIS — F0390 Unspecified dementia without behavioral disturbance: Secondary | ICD-10-CM | POA: Diagnosis present

## 2020-04-25 DIAGNOSIS — N184 Chronic kidney disease, stage 4 (severe): Secondary | ICD-10-CM | POA: Diagnosis present

## 2020-04-25 DIAGNOSIS — E785 Hyperlipidemia, unspecified: Secondary | ICD-10-CM | POA: Diagnosis present

## 2020-04-25 DIAGNOSIS — U071 COVID-19: Secondary | ICD-10-CM | POA: Diagnosis present

## 2020-04-25 DIAGNOSIS — G9341 Metabolic encephalopathy: Secondary | ICD-10-CM

## 2020-04-25 DIAGNOSIS — N179 Acute kidney failure, unspecified: Secondary | ICD-10-CM | POA: Diagnosis present

## 2020-04-25 DIAGNOSIS — K219 Gastro-esophageal reflux disease without esophagitis: Secondary | ICD-10-CM | POA: Diagnosis present

## 2020-04-25 DIAGNOSIS — I700 Atherosclerosis of aorta: Secondary | ICD-10-CM | POA: Diagnosis present

## 2020-04-25 DIAGNOSIS — E870 Hyperosmolality and hypernatremia: Secondary | ICD-10-CM | POA: Diagnosis present

## 2020-04-25 DIAGNOSIS — F0280 Dementia in other diseases classified elsewhere without behavioral disturbance: Secondary | ICD-10-CM | POA: Diagnosis not present

## 2020-04-25 DIAGNOSIS — Z90710 Acquired absence of both cervix and uterus: Secondary | ICD-10-CM

## 2020-04-25 DIAGNOSIS — E875 Hyperkalemia: Secondary | ICD-10-CM | POA: Diagnosis present

## 2020-04-25 DIAGNOSIS — R4182 Altered mental status, unspecified: Secondary | ICD-10-CM | POA: Diagnosis not present

## 2020-04-25 DIAGNOSIS — Z79899 Other long term (current) drug therapy: Secondary | ICD-10-CM

## 2020-04-25 DIAGNOSIS — E878 Other disorders of electrolyte and fluid balance, not elsewhere classified: Secondary | ICD-10-CM | POA: Diagnosis present

## 2020-04-25 DIAGNOSIS — I129 Hypertensive chronic kidney disease with stage 1 through stage 4 chronic kidney disease, or unspecified chronic kidney disease: Secondary | ICD-10-CM | POA: Diagnosis present

## 2020-04-25 DIAGNOSIS — Z9049 Acquired absence of other specified parts of digestive tract: Secondary | ICD-10-CM | POA: Diagnosis not present

## 2020-04-25 DIAGNOSIS — R627 Adult failure to thrive: Secondary | ICD-10-CM

## 2020-04-25 DIAGNOSIS — J1282 Pneumonia due to coronavirus disease 2019: Secondary | ICD-10-CM | POA: Diagnosis present

## 2020-04-25 DIAGNOSIS — Z7982 Long term (current) use of aspirin: Secondary | ICD-10-CM | POA: Diagnosis not present

## 2020-04-25 DIAGNOSIS — N189 Chronic kidney disease, unspecified: Secondary | ICD-10-CM | POA: Diagnosis not present

## 2020-04-25 LAB — COMPREHENSIVE METABOLIC PANEL
ALT: 19 U/L (ref 0–44)
AST: 38 U/L (ref 15–41)
Albumin: 3.5 g/dL (ref 3.5–5.0)
Alkaline Phosphatase: 100 U/L (ref 38–126)
Anion gap: 14 (ref 5–15)
BUN: 50 mg/dL — ABNORMAL HIGH (ref 8–23)
CO2: 19 mmol/L — ABNORMAL LOW (ref 22–32)
Calcium: 8.7 mg/dL — ABNORMAL LOW (ref 8.9–10.3)
Chloride: 113 mmol/L — ABNORMAL HIGH (ref 98–111)
Creatinine, Ser: 2.96 mg/dL — ABNORMAL HIGH (ref 0.44–1.00)
GFR, Estimated: 15 mL/min — ABNORMAL LOW (ref >60)
Glucose, Bld: 109 mg/dL — ABNORMAL HIGH (ref 70–99)
Potassium: 5 mmol/L (ref 3.5–5.1)
Sodium: 146 mmol/L — ABNORMAL HIGH (ref 135–145)
Total Bilirubin: 0.7 mg/dL (ref 0.3–1.2)
Total Protein: 7.5 g/dL (ref 6.5–8.1)

## 2020-04-25 LAB — CBC WITH DIFFERENTIAL/PLATELET
Abs Immature Granulocytes: 0.02 10*3/uL (ref 0.00–0.07)
Basophils Absolute: 0 10*3/uL (ref 0.0–0.1)
Basophils Relative: 0 %
Eosinophils Absolute: 0 10*3/uL (ref 0.0–0.5)
Eosinophils Relative: 0 %
HCT: 43.7 % (ref 36.0–46.0)
Hemoglobin: 13.8 g/dL (ref 12.0–15.0)
Immature Granulocytes: 0 %
Lymphocytes Relative: 19 %
Lymphs Abs: 1 10*3/uL (ref 0.7–4.0)
MCH: 27.9 pg (ref 26.0–34.0)
MCHC: 31.6 g/dL (ref 30.0–36.0)
MCV: 88.3 fL (ref 80.0–100.0)
Monocytes Absolute: 0.1 10*3/uL (ref 0.1–1.0)
Monocytes Relative: 1 %
Neutro Abs: 4.4 10*3/uL (ref 1.7–7.7)
Neutrophils Relative %: 80 %
Platelets: 263 10*3/uL (ref 150–400)
RBC: 4.95 MIL/uL (ref 3.87–5.11)
RDW: 14.4 % (ref 11.5–15.5)
WBC: 5.4 10*3/uL (ref 4.0–10.5)
nRBC: 0 % (ref 0.0–0.2)

## 2020-04-25 LAB — LACTIC ACID, PLASMA: Lactic Acid, Venous: 1.2 mmol/L (ref 0.5–1.9)

## 2020-04-25 MED ORDER — SODIUM CHLORIDE 0.9 % IV BOLUS
1000.0000 mL | Freq: Once | INTRAVENOUS | Status: AC
  Administered 2020-04-26: 1000 mL via INTRAVENOUS

## 2020-04-25 NOTE — ED Provider Notes (Signed)
[facility] Emergency Department Provider Note   ____________________________________________   Event Date/Time   First MD Initiated Contact with Patient 04/25/20 2251     (approximate)  I have reviewed the triage vital signs and the nursing notes.   HISTORY  Chief Complaint COVID+    HPI Michelle Carney is a 87 y.o. female with a stated past medical history of hypertension, CKD, and dementia who presents for the third time in 1 week after a positive COVID diagnosis 1 week ago with her family versus stating that she has worsening altered mental status and is concerned that she may be dehydrated. Patient is unable to participate in history or review of systems at this time. Family member states that patient was diagnosed with COVID last Tuesday and has had worsening altered mental status over the last couple weeks. Patient's son also states that she has had decreased p.o. intake over this time and is concerned that she is dehydrated given her history of chronic kidney disease.         Past Medical History:  Diagnosis Date  . Hypertension   . Renal disorder     Patient Active Problem List   Diagnosis Date Noted  . AKI (acute kidney injury) ([place]) 04/25/2020  . Syncope 07/02/2016    Past Surgical History:  Procedure Laterality Date  . ABDOMINAL HYSTERECTOMY    . CHOLECYSTECTOMY      Prior to Admission medications   Medication Sig Start Date End Date Taking? Authorizing Provider  amLODipine (NORVASC) 5 MG tablet Take 5 mg by mouth daily. 06/20/19  Yes [provider]  aspirin 81 MG chewable tablet Chew 81 mg by mouth daily.   Yes [provider]  donepezil (ARICEPT) 10 MG tablet Take 10 mg by mouth daily. 06/14/19  Yes [provider]  doxycycline (VIBRAMYCIN) 100 MG capsule Take 100 mg by mouth 2 (two) times daily. 04/16/20  Yes [provider]  famotidine (PEPCID) 20 MG tablet Take 20 mg by mouth daily as needed.  07/25/19  Yes [provider]  memantine (NAMENDA) 5 MG tablet Take 5 mg by mouth daily. 07/18/19  Yes [provider]  Multiple Vitamins-Minerals (MULTIVITAMIN ADULTS 50+) TABS Take 1 tablet by mouth daily.   Yes [provider]  lovastatin (MEVACOR) 20 MG tablet Take 20 mg by mouth daily at 6 PM. Patient not taking: Reported on 04/26/2020    [provider]    Allergies Patient has no known allergies.  Family History  Family history unknown: Yes    Social History Social History   Tobacco Use  . Smoking status: Never Smoker  . Smokeless tobacco: Current User    Types: Snuff  Substance Use Topics  . Alcohol use: No    Review of Systems Unable to assess secondary to mental status  ____________________________________________   PHYSICAL EXAM:  VITAL SIGNS: ED Triage Vitals  Enc Vitals Group     BP 04/25/20 1630 112/62     Pulse Rate 04/25/20 1626 60     Resp 04/25/20 1633 20     Temp 04/25/20 1626 98.1 F (36.7 C)     Temp Source 04/25/20 1626 Oral     SpO2 04/25/20 1626 94 %     Weight 04/25/20 1633 123 lb 7.3 oz (56 kg)     Height 04/25/20 1633 5\' 5"  (1.651 m)     Head Circumference --      Peak Flow --  Pain Score --      Pain Loc --      Pain Edu? --      Excl. in [place]? --    Constitutional: Alert and orientedx0. Cachectic, sleeping in bed. Eyes: Conjunctivae are injected. PERRL. Head: Atraumatic. Nose: No congestion/rhinnorhea. Mouth/Throat: Mucous membranes are moist. Neck: No stridor Cardiovascular: Grossly normal heart sounds.  Good peripheral circulation. Respiratory: Normal respiratory effort.  No retractions. Gastrointestinal: Soft and nontender. No distention. Musculoskeletal: No obvious deformities Neurologic:  Normal speech and language. No gross focal neurologic deficits are appreciated. Skin:  Skin is warm and dry. No rash noted. Psychiatric: Agitated  ____________________________________________    LABS (all labs ordered are listed, but only abnormal results are displayed)  Labs Reviewed  COMPREHENSIVE METABOLIC PANEL - Abnormal; Notable for the following components:      Result Value   Sodium 146 (*)    Chloride 113 (*)    CO2 19 (*)    Glucose, Bld 109 (*)    BUN 50 (*)    Creatinine, Ser 2.96 (*)    Calcium 8.7 (*)    GFR, Estimated 15 (*)    All other components within normal limits  RESP PANEL BY RT-PCR (FLU A&B, COVID) ARPGX2  LACTIC ACID, PLASMA  CBC WITH DIFFERENTIAL/PLATELET  FIBRINOGEN  URINALYSIS, COMPLETE (UACMP) WITH MICROSCOPIC  BRAIN NATRIURETIC PEPTIDE  C-REACTIVE PROTEIN  FIBRIN DERIVATIVES D-DIMER ([facility] ONLY)  FERRITIN  LACTATE DEHYDROGENASE  PROCALCITONIN  TROPONIN I (HIGH SENSITIVITY)    RADIOLOGY  ED MD interpretation: Single view portable x-ray of the chest shows multifocal airspace opacities concerning for multifocal pneumonia.  There is no evidence of pneumothorax or widened mediastinum  Official radiology report(s): DG Chest Port 1 View  Result Date: 04/26/2020 CLINICAL DATA:  COVID-19 EXAM: PORTABLE CHEST 1 VIEW COMPARISON:  None. FINDINGS: The patient is rotated. There is multifocal airspace opacity within both lungs, greatest at the left lung base. No pleural effusion. IMPRESSION: Multifocal airspace opacities concerning for multifocal pneumonia. Electronically Signed   By: Ulyses Jarred M.D.   On: 04/26/2020 00:25    ____________________________________________   PROCEDURES  Procedure(s) performed (including Critical Care):  .1-3 Lead EKG Interpretation Performed by: Naaman Plummer, MD Authorized by: Naaman Plummer, MD     Interpretation: normal     ECG rate:  96   ECG rate assessment: normal     Rhythm: sinus rhythm     Ectopy: none     Conduction: normal       ____________________________________________   INITIAL IMPRESSION / ASSESSMENT AND PLAN / ED COURSE  As part of my medical decision making, I reviewed the  following data within the [facility] notes reviewed and incorporated, Labs reviewed, Old chart reviewed, Radiograph reviewed and Notes from prior ED visits reviewed and incorporated        Presentation most consistent with Viral Syndrome.  Patient has tested positive for COVID-19. At this time patient is not requiring submental oxygenation. Patient shows significant alteration in her mental status from baseline Given History and Exam I have a lower suspicion for: Emergent CardioPulmonary causes [such as Acute Asthma or COPD Exacerbation, acute Heart Failure or exacerbation, PE, PTX, atypical ACS, PNA]. Emergent Otolaryngeal causes [such as PTA, RPA, Lud[place]igs, Epiglottitis, EBV].  Regarding Emergent Travel or Immunosuppressive related infectious: I have a low suspicion for acute HIV.  Given worsening renal function and mental status as well as need for further evaluation and management, patient will require  admission      ____________________________________________   FINAL CLINICAL IMPRESSION(S) / ED DIAGNOSES  Final diagnoses:  Altered mental status, unspecified altered mental status type  COVID-19 virus infection  Acute renal failure superimposed on chronic kidney disease, unspecified CKD stage, unspecified acute renal failure type [facility])     ED Discharge Orders    None       Note:  This document was prepared using Dragon voice recognition software and may include unintentional dictation errors.   Naaman Plummer, MD 04/26/20 [phone]

## 2020-04-25 NOTE — ED Notes (Signed)
First nurse note: family called ems because pt is dehydrated. Pt dx with covid a week ago. Has been here 3 times this week for same. Wants fluids. VSS. Hx of dementia.

## 2020-04-25 NOTE — ED Triage Notes (Signed)
See first nurse note. Pt alert. Repeatedly stating "I am cold". Warm blankets have been provided. Disoriented x4. NAD noted, RR even and unlabored  Per ems sent for fluids, covid +

## 2020-04-26 ENCOUNTER — Encounter: Payer: Self-pay | Admitting: Family Medicine

## 2020-04-26 ENCOUNTER — Inpatient Hospital Stay: Payer: Medicare HMO

## 2020-04-26 ENCOUNTER — Other Ambulatory Visit: Payer: Self-pay

## 2020-04-26 DIAGNOSIS — G309 Alzheimer's disease, unspecified: Secondary | ICD-10-CM

## 2020-04-26 DIAGNOSIS — F0280 Dementia in other diseases classified elsewhere without behavioral disturbance: Secondary | ICD-10-CM

## 2020-04-26 DIAGNOSIS — R4182 Altered mental status, unspecified: Secondary | ICD-10-CM

## 2020-04-26 DIAGNOSIS — N179 Acute kidney failure, unspecified: Secondary | ICD-10-CM

## 2020-04-26 DIAGNOSIS — U071 COVID-19: Secondary | ICD-10-CM

## 2020-04-26 LAB — PROCALCITONIN: Procalcitonin: <0.1 ng/mL

## 2020-04-26 LAB — BRAIN NATRIURETIC PEPTIDE: B Natriuretic Peptide: 76.7 pg/mL (ref 0.0–100.0)

## 2020-04-26 LAB — FIBRINOGEN: Fibrinogen: 352 mg/dL (ref 210–475)

## 2020-04-26 LAB — FIBRIN DERIVATIVES D-DIMER (ARMC ONLY): Fibrin derivatives D-dimer (ARMC): 4187.79 ng/mL (FEU) — ABNORMAL HIGH (ref 0.00–499.00)

## 2020-04-26 LAB — LACTATE DEHYDROGENASE: LDH: 212 U/L — ABNORMAL HIGH (ref 98–192)

## 2020-04-26 LAB — RESP PANEL BY RT-PCR (FLU A&B, COVID) ARPGX2
Influenza A by PCR: NEGATIVE
Influenza B by PCR: NEGATIVE
SARS Coronavirus 2 by RT PCR: POSITIVE — AB

## 2020-04-26 LAB — TROPONIN I (HIGH SENSITIVITY): Troponin I (High Sensitivity): 37 ng/L — ABNORMAL HIGH (ref <18)

## 2020-04-26 LAB — FERRITIN: Ferritin: 178 ng/mL (ref 11–307)

## 2020-04-26 LAB — C-REACTIVE PROTEIN: CRP: 0.6 mg/dL (ref <1.0)

## 2020-04-26 MED ORDER — ASCORBIC ACID 500 MG PO TABS
500.0000 mg | ORAL_TABLET | Freq: Every day | ORAL | Status: DC
  Administered 2020-04-27: 500 mg via ORAL
  Filled 2020-04-26 (×4): qty 1

## 2020-04-26 MED ORDER — ZINC SULFATE 220 (50 ZN) MG PO CAPS
220.0000 mg | ORAL_CAPSULE | Freq: Every day | ORAL | Status: DC
  Administered 2020-04-27: 10:00:00 220 mg via ORAL
  Filled 2020-04-26 (×4): qty 1

## 2020-04-26 MED ORDER — ACETAMINOPHEN 325 MG PO TABS: 650.0000 mg | ORAL_TABLET | Freq: Four times a day (QID) | ORAL | Status: DC | PRN

## 2020-04-26 MED ORDER — ENOXAPARIN SODIUM 30 MG/0.3ML ~~LOC~~ SOLN
30.0000 mg | SUBCUTANEOUS | Status: DC
  Administered 2020-04-26: 14:00:00 30 mg via SUBCUTANEOUS
  Filled 2020-04-26: qty 0.3

## 2020-04-26 MED ORDER — HALOPERIDOL LACTATE 5 MG/ML IJ SOLN: 1.0000 mg | Freq: Four times a day (QID) | INTRAMUSCULAR | Status: DC | PRN

## 2020-04-26 MED ORDER — HALOPERIDOL LACTATE 5 MG/ML IJ SOLN
2.5000 mg | Freq: Once | INTRAMUSCULAR | Status: AC
  Administered 2020-04-26: 2.5 mg via INTRAMUSCULAR
  Filled 2020-04-26: qty 1

## 2020-04-26 MED ORDER — MEMANTINE HCL 5 MG PO TABS
5.0000 mg | ORAL_TABLET | Freq: Every day | ORAL | Status: DC
  Administered 2020-04-27: 5 mg via ORAL
  Filled 2020-04-26 (×4): qty 1

## 2020-04-26 MED ORDER — MAGNESIUM HYDROXIDE 400 MG/5ML PO SUSP
30.0000 mL | Freq: Every day | ORAL | Status: DC | PRN
  Filled 2020-04-26: qty 30

## 2020-04-26 MED ORDER — METHYLPREDNISOLONE SODIUM SUCC 125 MG IJ SOLR
1.0000 mg/kg | Freq: Two times a day (BID) | INTRAMUSCULAR | Status: DC
  Administered 2020-04-26 – 2020-04-27 (×3): 56.25 mg via INTRAVENOUS
  Filled 2020-04-26 (×2): qty 2

## 2020-04-26 MED ORDER — TRAZODONE HCL 50 MG PO TABS: 25.0000 mg | ORAL_TABLET | Freq: Every evening | ORAL | Status: DC | PRN

## 2020-04-26 MED ORDER — AMLODIPINE BESYLATE 5 MG PO TABS
5.0000 mg | ORAL_TABLET | Freq: Every day | ORAL | Status: DC
Start: 2020-04-26 — End: 2020-04-30
  Administered 2020-04-27: 5 mg via ORAL
  Filled 2020-04-26 (×3): qty 1

## 2020-04-26 MED ORDER — FAMOTIDINE 20 MG PO TABS
20.0000 mg | ORAL_TABLET | Freq: Every day | ORAL | Status: DC
  Filled 2020-04-26: qty 1

## 2020-04-26 MED ORDER — ONDANSETRON HCL 4 MG/2ML IJ SOLN: 4.0000 mg | Freq: Four times a day (QID) | INTRAMUSCULAR | Status: DC | PRN

## 2020-04-26 MED ORDER — ASPIRIN 81 MG PO CHEW
81.0000 mg | CHEWABLE_TABLET | Freq: Every day | ORAL | Status: DC
  Administered 2020-04-27: 81 mg via ORAL
  Filled 2020-04-26 (×4): qty 1

## 2020-04-26 MED ORDER — DONEPEZIL HCL 5 MG PO TABS
10.0000 mg | ORAL_TABLET | Freq: Every day | ORAL | Status: DC
  Administered 2020-04-27: 23:00:00 10 mg via ORAL
  Filled 2020-04-26: qty 2

## 2020-04-26 MED ORDER — PRAVASTATIN SODIUM 20 MG PO TABS: 20.0000 mg | ORAL_TABLET | Freq: Every day | ORAL | Status: DC

## 2020-04-26 MED ORDER — ONDANSETRON HCL 4 MG PO TABS: 4.0000 mg | ORAL_TABLET | Freq: Four times a day (QID) | ORAL | Status: DC | PRN

## 2020-04-26 MED ORDER — SODIUM CHLORIDE 0.9 % IV SOLN: 100.0000 mg | Freq: Every day | INTRAVENOUS | Status: DC

## 2020-04-26 MED ORDER — SODIUM CHLORIDE 0.45 % IV SOLN: INTRAVENOUS | Status: DC

## 2020-04-26 MED ORDER — SODIUM CHLORIDE 0.9 % IV SOLN: INTRAVENOUS | Status: DC

## 2020-04-26 MED ORDER — SODIUM CHLORIDE 0.9 % IV SOLN
200.0000 mg | Freq: Once | INTRAVENOUS | Status: AC
  Administered 2020-04-26: 200 mg via INTRAVENOUS
  Filled 2020-04-26: qty 40

## 2020-04-26 NOTE — Progress Notes (Signed)
PHARMACIST - PHYSICIAN COMMUNICATION  CONCERNING:  Enoxaparin (Lovenox) for DVT Prophylaxis    RECOMMENDATION: Patient was prescribed enoxaprin 40mg  q24 hours for VTE prophylaxis.   Filed Weights   04/25/20 1633  Weight: 56 kg (123 lb 7.3 oz)    Body mass index is 20.54 kg/m.  Estimated Creatinine Clearance: 11.6 mL/min (A) (by C-G formula based on SCr of 2.96 mg/dL (H)).   Patient is candidate for enoxaparin 30mg  every 24 hours based on CrCl <53ml/min or Weight <45kg  DESCRIPTION: Pharmacy has adjusted enoxaparin dose per [facility] policy.  Patient is now receiving enoxaparin 30 mg every 24 hours    Ena Dawley, PharmD Clinical Pharmacist  04/26/2020 6:55 AM

## 2020-04-26 NOTE — Progress Notes (Signed)
[place] [facility] [place] NAME: Michelle Carney    MR#:  [id number]  DATE OF BIRTH:  11-Mar-1930  SUBJECTIVE:  patient baseline has dementia. Resting. No complaints at present. She was given some sedating meds in the ER when I saw her earlier  REVIEW OF SYSTEMS:   Review of Systems  Unable to perform ROS: Dementia   Tolerating Diet: Tolerating PT:   DRUG ALLERGIES:  No Known Allergies  VITALS:  Blood pressure 127/62, pulse (!) 59, temperature 98.5 F (36.9 C), resp. rate 20, height 5\' 5"  (1.651 m), weight 56 kg, SpO2 91 %.  PHYSICAL EXAMINATION:   Physical Examlimited  GENERAL:  87 y.o.-year-old patient lying in the bed with no acute distress.  LUNGS: Normal breath sounds bilaterally, no wheezing, rales, rhonchi. No use of accessory muscles of respiration.  CARDIOVASCULAR: S1, S2 normal. No murmurs, rubs, or gallops.  ABDOMEN: Soft, nontender, nondistended. Bowel sounds present. No organomegaly or mass.  EXTREMITIES: No cyanosis, clubbing or edema b/l.    NEUROLOGIC: grossly nonfocal. At baseline has dementia PSYCHIATRIC:  patient is alert on VC but sleepy SKIN: No obvious rash, lesion, or ulcer.   LABORATORY PANEL:  CBC Recent Labs  Lab 04/25/20 1638  WBC 5.4  HGB 13.8  HCT 43.7  PLT 263    Chemistries  Recent Labs  Lab 04/25/20 1638  NA 146*  K 5.0  CL 113*  CO2 19*  GLUCOSE 109*  BUN 50*  CREATININE 2.96*  CALCIUM 8.7*  AST 38  ALT 19  ALKPHOS 100  BILITOT 0.7   Cardiac Enzymes No results for input(s): TROPONINI in the last 168 hours. RADIOLOGY:  DG Chest Port 1 View  Result Date: 04/26/2020 CLINICAL DATA:  COVID-19 EXAM: PORTABLE CHEST 1 VIEW COMPARISON:  None. FINDINGS: The patient is rotated. There is multifocal airspace opacity within both lungs, greatest at the left lung base. No pleural effusion. IMPRESSION: Multifocal airspace opacities concerning for multifocal pneumonia. Electronically Signed   By:  Ulyses Jarred M.D.   On: 04/26/2020 00:25   ASSESSMENT AND PLAN:  Michelle Carney  is a 87 y.o. African-American female with a known history of hypertension and stage IV chronic kidney disease, as well as recent COVID-19 infection diagnosed on Tuesday who presented to the emergency room with acute onset of altered mental status with failure to thrive ER Pulse oxymetry was 94% and later 92% on room air and temperature was 98.1 with normal BP, respiratory rate and heart rate   Multifocal pneumonia secondary to COVID-19 with associated encephalopathy. H/o Dementia -on scheduled Mucinex and as needed Tussionex. -  on IV Remdesivir and IV steroid therapy with IV Solu-Medrol with elevated inflammatory markers. -- Pro calcitonin less than 0.1, no fever, white count 5.4. Lactic acid 1.2 -- continue antibiotics -- CRP 0.6  Acute kidney injury superimposed on stage IV chronic kidney disease. Hypernatremia/hypochloremia - cont with hydration with IV 1/2normal saline and follow BMP. - avoidnephrotoxins.  Essential hypertension. - continue amlodipine.   Dementia possibly with behavioral changes. -  continue Aricept and Namenda.   Dyslipidemia. - continue statin therapy.  GERD. - continue H2 blocker therapy.   DVT prophylaxis. - Subcutaneous Lovenox.    CODE STATUS: Dr Michelle Carney discussed the CODE STATUS with the patient's son and he clearly indicated that he wants to place her on a DNR/DNI status  Status is: Inpatient  Remains inpatient appropriate because:Altered mental status  Dispo: The patient is from: Home  Anticipated d/c is to: Home  Anticipated d/c date is: 1-2days  Patient currently is not medically stable to d/c. treating for COVID pneumonia PT pending        TOTAL TIME TAKING CARE OF THIS PATIENT: *25* minutes.  >50% time spent on counselling and coordination of care  Note: This dictation was prepared with Dragon  dictation along with smaller phrase technology. Any transcriptional errors that result from this process are unintentional.  Michelle Carney M.D    [facility]   CC: Primary care physician; Michelle Carney, MDPatient ID: Michelle Carney, female   DOB: 10-Mar-1930, 87 y.o.   MRN: [id number]

## 2020-04-26 NOTE — TOC Initial Note (Addendum)
Transition of Care [facility]) - Initial/Assessment Note    Patient Details  Name: Michelle Carney MRN: [id number] Date of Birth: 1930-01-14  Transition of Care [facility]) CM/SW Contact:    Shelbie Hutching, RN Phone Number: 04/26/2020, 3:46 PM  Clinical Narrative:                 Patient admitted to the hospital with altered mental status and COVID.  MD reports that patient may be ready for discharge as soon as tomorrow.  Patient is from home with her son, Michelle Carney.  RNCM was able to speak with [facility] via phone.  Michelle Carney says that patient has dementia at baseline but is able to get around the house without assistive devices and get to the bathroom herself.  He does help her bath.  Michelle Carney thinks that patient would benefit from a Cane because she does use the walls in the home to help her balance.  [employer] and 3 in 1 ordered from [employer] and will be delivered to the room.  The 3 in 1 can be taken apart and used over the toilet so patient can more easily get up and down from the commode.  Helene Kelp with [facility] has accepted home health referral for RN, PT, OT and aide.  Michelle Carney provides all needed transportation and will be picking her up at discharge.  Patient is current with her PCP and uses [facility] mail order for prescriptions.   Expected Discharge Plan: [place] Barriers to Discharge: Continued Medical Work up   Patient Goals and CMS Choice Patient states their goals for this hospitalization and ongoing recovery are:: Patient's son would like for her to return home at discharge. CMS Medicare.gov Compare Post Acute Care list provided to:: Patient Represenative (must comment) Choice offered to / list presented to : Adult Children  Expected Discharge Plan and Services Expected Discharge Plan: [place]   Discharge Planning Services: CM Consult Post Acute Care Choice: [place] arrangements for the past 2 months: [place]                 DME Arranged:  Cane,3-N-1 DME Agency: [employer] Date DME Agency Contacted: 04/26/20 Time DME Agency Contacted: [phone] Representative spoke with at DME Agency: Mardene Celeste HH Arranged: RN,PT,OT,Nurse's Aide [place]: [facility] [employer] [facility] [employer] Date [place]: 04/26/20 Time [place]: [phone] Representative spoke with at [place]: [place] Arrangements/Services Living arrangements for the past 2 months: [place] Lives with:: Adult Children Patient language and need for interpreter reviewed:: Yes Do you feel safe going back to the place where you live?: Yes      Need for Family Participation in Patient Care: Yes (Comment) (dementia, COVID) Care giver support system in place?: Yes (comment) (son)   Criminal Activity/Legal Involvement Pertinent to Current Situation/Hospitalization: No - Comment as needed  Activities of Daily Living Home Assistive Devices/Equipment: None ADL Screening (condition at time of admission) Patient's cognitive ability adequate to safely complete daily activities?: No Is the patient deaf or have difficulty hearing?: No Does the patient have difficulty seeing, even when wearing glasses/contacts?: No Does the patient have difficulty concentrating, remembering, or making decisions?: Yes Patient able to express need for assistance with ADLs?: Yes Does the patient have difficulty dressing or bathing?: Yes Independently performs ADLs?: No Communication: Independent Dressing (OT): Needs assistance Is this a change from baseline?: Pre-admission baseline Grooming: Needs assistance Is this a  change from baseline?: Pre-admission baseline Feeding: Independent Bathing: Needs assistance Is this a change from baseline?: Pre-admission baseline Toileting: Independent In/Out Bed: Independent Walks in Home: Independent with device (comment) Does the patient have difficulty walking or climbing stairs?: Yes Weakness of Legs:  Both Weakness of Arms/Hands: Both  Permission Sought/Granted Permission sought to share information with : Case Manager,Family Supports,Other (comment) Permission granted to share information with : Yes, Verbal Permission Granted  Share Information with NAME: Michelle Carney  Permission granted to share info w AGENCY: [place] agency  Permission granted to share info w Relationship: son     Emotional Assessment Appearance:: Appears stated age       Alcohol / Substance Use: Not Applicable Psych Involvement: No (comment)  Admission diagnosis:  AKI (acute kidney injury) ([place]) [N17.9] Altered mental status, unspecified altered mental status type [R41.82] Acute renal failure superimposed on chronic kidney disease, unspecified CKD stage, unspecified acute renal failure type ([place]) [N17.9, N18.9] COVID-19 virus infection [U07.1] Patient Active Problem List   Diagnosis Date Noted  . Altered mental status   . Acute renal failure superimposed on chronic kidney disease ([place])   . COVID-19 virus infection   . Alzheimer's dementia without behavioral disturbance ([place])   . AKI (acute kidney injury) ([place]) 04/25/2020  . Syncope 07/02/2016   PCP:  Marguerita Merles, MD Pharmacy:  No Pharmacies Listed    Social Determinants of Health (SDOH) Interventions    Readmission Risk Interventions No flowsheet data found.

## 2020-04-26 NOTE — ED Notes (Signed)
  Attempted to start PIV on patient for IV fluids.  Patient was combative and would not tolerate procedure.  Patient would flail arms and lower her head towards me like she was going to bite me.  Patient has hx of dementia and notified Dr. Cheri Fowler of difficulties starting PIV.

## 2020-04-26 NOTE — Progress Notes (Signed)
Remdesivir - Pharmacy Brief Note     A/P:  Remdesivir 200 mg IVPB once followed by 100 mg IVPB daily x 4 days.   Hart Robinsons, PharmD Clinical Pharmacist  04/26/2020 1:22 AM

## 2020-04-26 NOTE — H&P (Signed)
Latest pulse ox consistently     Man   PATIENT NAME: Michelle Carney    MR#:  [id number]  DATE OF BIRTH:  [birth year]  DATE OF ADMISSION:  04/25/2020  PRIMARY CARE PHYSICIAN: Marguerita Merles, MD   REQUESTING/REFERRING PHYSICIAN: Clent Ridges, MD  CHIEF COMPLAINT:   Chief Complaint  Patient presents with  . COVID+    HISTORY OF PRESENT ILLNESS:  Michelle Carney  is a 87 y.o. African-American female with a known history of hypertension and stage IV chronic kidney disease, as well as recent COVID-19 infection diagnosed on Tuesday who presented to the emergency room with acute onset of altered mental status with failure to thrive.  The patient was noted by her son to walk in the house without her bottom clothes which she was out of norm for her baseline.  She did not have any reported fever or chills per her son who gave all the history. Per his report she has been having cough productive of loose sputum as well as dyspnea without wheezing. No reported loss of taste or smell. She has been feeling tired and fatigued and laying around. She was prescribed p.o. doxycycline here since Tuesday.. No reported nausea or vomiting or diarrhea or abdominal pain.  She has been seen in the ER twice before this time during the last week.  The patient is nonverbal with dementia.  Which came to the ER Pulse oxymetry was 94% and later 92% on room air and temperature was 98.1 with normal BP, respiratory rate and heart rate.  Labs revealed borderline potassium of 5 and mild hyponatremia 146 and hyperchloremia of 113 with a BUN of 50 and creatinine 2.96 compared to 38 and 2.1 on 04/21/2020.  Urinalysis showed 6-10 WBCs with rare bacteria and negative nitrite on 1/9.  Chest x-ray on 1/9 revealed no acute cardiopulmonary disease.  Repeat chest x-ray today showed multifocal airspace opacities concerning for multifocal pneumonia.EKG on 1/9 showed normal sinus rhythm with a rate of 62 with poor R wave  progression.  The patient was given 2.5 mg IM Haldol for earlier agitation and 1 L bolus of IV normal saline.  She will be admitted to a medical monitored bed for further evaluation and management.   PAST MEDICAL HISTORY:   Past Medical History:  Diagnosis Date  . Hypertension   . Renal disorder     PAST SURGICAL HISTORY:   Past Surgical History:  Procedure Laterality Date  . ABDOMINAL HYSTERECTOMY    . CHOLECYSTECTOMY      SOCIAL HISTORY:   Social History   Tobacco Use  . Smoking status: Never Smoker  . Smokeless tobacco: Current User    Types: Snuff  Substance Use Topics  . Alcohol use: No    FAMILY HISTORY:   Family History  Family history unknown: Yes    DRUG ALLERGIES:  No Known Allergies  REVIEW OF SYSTEMS:   ROS As per history of present illness. All pertinent systems were reviewed above. Constitutional, HEENT, cardiovascular, respiratory, GI, GU, musculoskeletal, neuro, psychiatric, endocrine, integumentary and hematologic systems were reviewed and are otherwise negative/unremarkable except for positive findings mentioned above in the HPI.   MEDICATIONS AT HOME:   Prior to Admission medications   Medication Sig Start Date End Date Taking? Authorizing Provider  amLODipine (NORVASC) 5 MG tablet  06/20/19   [provider]  aspirin 81 MG chewable tablet Chew 81 mg by mouth daily.    [provider]  donepezil (ARICEPT) 10  MG tablet  06/14/19   [provider]  famotidine (PEPCID) 20 MG tablet  07/25/19   [provider]  lovastatin (MEVACOR) 20 MG tablet Take 20 mg by mouth daily at 6 PM.    [provider]  memantine (NAMENDA) 5 MG tablet  07/18/19   [provider]      VITAL SIGNS:  Blood pressure 125/76, pulse 97, temperature 98.1 F (36.7 C), temperature source Oral, resp. rate 20, height 5\' 5"  (1.651 m), weight 56 kg, SpO2 92 %.  PHYSICAL EXAMINATION:  Physical Exam  GENERAL:  87  y.o.-year-old patient lying in the bed with no acute distress.  EYES: Pupils equal, round, reactive to light and accommodation. No scleral icterus. Extraocular muscles intact.  HEENT: Head atraumatic, normocephalic. Oropharynx and nasopharynx clear.  NECK:  Supple, no jugular venous distention. No thyroid enlargement, no tenderness.  LUNGS: Diminished bibasal breath sounds with bibasal crackles.   CARDIOVASCULAR: Regular rate and rhythm, S1, S2 normal. No murmurs, rubs, or gallops.  ABDOMEN: Soft, nondistended, nontender. Bowel sounds present. No organomegaly or mass.  EXTREMITIES: No pedal edema, cyanosis, or clubbing.  NEUROLOGIC: Cranial nerves II through XII are intact. Muscle strength 5/5 in all extremities. Sensation intact. Gait not checked.  PSYCHIATRIC: The patient is alert and oriented x 3.  Normal affect and good eye contact. SKIN: No obvious rash, lesion, or ulcer.   LABORATORY PANEL:   CBC Recent Labs  Lab 04/25/20 1638  WBC 5.4  HGB 13.8  HCT 43.7  PLT 263   ------------------------------------------------------------------------------------------------------------------  Chemistries  Recent Labs  Lab 04/25/20 1638  NA 146*  K 5.0  CL 113*  CO2 19*  GLUCOSE 109*  BUN 50*  CREATININE 2.96*  CALCIUM 8.7*  AST 38  ALT 19  ALKPHOS 100  BILITOT 0.7   ------------------------------------------------------------------------------------------------------------------  Cardiac Enzymes No results for input(s): TROPONINI in the last 168 hours. ------------------------------------------------------------------------------------------------------------------  RADIOLOGY:  No results found.    IMPRESSION AND PLAN:   1.   Multifocal pneumonia secondary to COVID-19 with associated encephalopathy. -The patient will be admitted to an isolation monitored bed with droplet and contact precautions. -Given multifocal pneumonia we will empirically place the patient on IV  Rocephin and Zithromax for possible bacterial superinfection only with elevated Procalcitonin. -The patient will be placed on scheduled Mucinex and as needed Tussionex. -We will avoid nebulization as much as we can, give bronchodilator MDI if needed, and with deterioration of oxygenation try to avoid BiPAP/CPAP if possible.    -Will obtain sputum Gram stain culture and sensitivity and follow blood cultures. -O2 protocol will be followed. -We will follow CRP, ferritin, LDH and D-dimer. -Will follow manual differential for ANC/ALC ratio as well as follow troponin I and daily CBC with manual differential and CMP. - Will place the patient on IV Remdesivir and IV steroid therapy with IV Solu-Medrol with elevated inflammatory markers. -The patient will be placed on vitamin D3, vitamin C, zinc sulfate, p.o. Pepcid and aspirin. -We will place her on as needed IM Haldol for agitation.  2.  Acute kidney injury superimposed on stage IV chronic kidney disease. - We'll place on hydration with IV normal saline and follow BMP. - We'll hold off nephrotoxins.  3.  Essential hypertension. - We'll continue amlodipine.  4.  Dementia possibly with behavioral changes. - We'll continue Aricept and Namenda.  5.  Dyslipidemia. - We'll continue statin therapy.  6.  GERD. - We'll continue H2 blocker therapy.  7.  DVT prophylaxis. -  Subcutaneous Lovenox.    All the records are reviewed and case discussed with ED provider. The plan of care was discussed in details with the patient (and family). I answered all questions. The patient agreed to proceed with the above mentioned plan. Further management will depend upon hospital course.   CODE STATUS: I discussed the CODE STATUS with the patient's son and he clearly indicated that he wants to place her on a DNR/DNI status  Status is: Inpatient  Remains inpatient appropriate because:Altered mental status, Ongoing diagnostic testing needed not appropriate for  outpatient work up, Unsafe d/c plan, IV treatments appropriate due to intensity of illness or inability to take PO and Inpatient level of care appropriate due to severity of illness   Dispo: The patient is from: Home              Anticipated d/c is to: Home              Anticipated d/c date is: 3 days              Patient currently is not medically stable to d/c.   TOTAL TIME TAKING CARE OF THIS PATIENT: 55 minutes.    Christel Mormon M.D on 04/26/2020 at 12:23 AM  [facility]   From 7 PM-7 AM, contact night-coverage [email or web address]  CC: Primary care physician; Marguerita Merles, MD

## 2020-04-26 NOTE — Evaluation (Addendum)
Physical Therapy Evaluation Patient Details Name: Michelle Carney MRN: [id number] DOB: [birth year] Today's Date: 04/26/2020   History of Present Illness  Pt is an 87 y/o F admitted on 04/25/20 after presenting with acute onset of AMS with failure to thrive. Pt had been having cough productive of loose sputum, dsypnea without wheezing, & fatigued. Pt being treated for multifocal pneumonia 2/2 COVID 19 infection with associated encephalopathy. PMH: HTN, stage 4 CKD, dementia  Clinical Impression  Pt received asleep in bed with mitten donned to LUE throughout session, splint to RUE (presumed to assist with IV placement). Pt awakened with verbal/tactile stimulation & agreeable to transitioning to EOB. Pt requires mod assist for supine>sit, max assist for sit>supine. Pt is able to scoot to EOB with extra time & min assist and complete sit>stand with max assist with pt demonstrating posterior lean. Encouraged pt to take side steps EOB but pt with LOB back onto bed. Pt returns supine then reports need to void & transitions to EOB again. PT placed [facility] & awaiting on bucket to place in [facility] but pt attempts to pivot to [facility] despite PT instructing pt to wait. Pt continues to attempt to transfer to [facility] & sit on frame, PT attempts to place seat down but unable & pt requires +2 assist safely back to EOB after voiding on the floor. Pt requires total assist for rolling to allow PT & nurse to change bed sheets. Pt found to have skin tear to L hip - nurse made aware. Pt would benefit from acute PT services to progress gait as able, as well as strengthening & balance. Currently recommending STR following d/c to maximize independence with functional mobility prior to return home.  PT contacted pt's son Michelle Carney) via telephone who provided PLOF information. He reports pt is confused at baseline but reports it has worsened since covid dx.    Follow Up Recommendations SNF    Equipment Recommendations  None recommended by PT     Recommendations for Other Services       Precautions / Restrictions Precautions Precautions: Fall Restrictions Weight Bearing Restrictions: No      Mobility  Bed Mobility Overal bed mobility: Needs Assistance Bed Mobility: Rolling;Supine to Sit;Sit to Supine Rolling: Max assist;Total assist   Supine to sit: Mod assist Sit to supine: Max assist        Transfers Overall transfer level: Needs assistance   Transfers: Sit to/from Stand;Stand Pivot Transfers (HHA) Sit to Stand: Max assist Stand pivot transfers: Total assist;+2 physical assistance          Ambulation/Gait                Stairs            Wheelchair Mobility    Modified Rankin (Stroke Patients Only)       Balance Overall balance assessment: Needs assistance Sitting-balance support: Bilateral upper extremity supported;Feet supported Sitting balance-Leahy Scale: Fair Sitting balance - Comments: CGA sitting EOB   Standing balance support: Bilateral upper extremity supported Standing balance-Leahy Scale: Poor Standing balance comment: +2 assist for safety                             Pertinent Vitals/Pain Pain Assessment: Faces Faces Pain Scale: No hurt    Home Living Family/patient expects to be discharged to:: Private residence Living Arrangements: Children Available Help at Discharge: Family;Available 24 hours/day Type of Home: House Home Access: Stairs to enter Entrance  Stairs-Rails: None Entrance Stairs-Number of Steps: 2 Home Layout: One level Home Equipment: None      Prior Function Level of Independence: Needs assistance         Comments: Pt's son reports she was able to ambulate household distances holding to furniture. Son assisted with cooking, bathing, dressing.     Hand Dominance        Extremity/Trunk Assessment   Upper Extremity Assessment Upper Extremity Assessment: Generalized weakness    Lower Extremity Assessment Lower Extremity  Assessment: Generalized weakness    Cervical / Trunk Assessment Cervical / Trunk Assessment: Kyphotic  Communication      Cognition Arousal/Alertness: Lethargic Behavior During Therapy: Flat affect;Impulsive;Restless Overall Cognitive Status: History of cognitive impairments - at baseline (no family to determine baseline)                                        General Comments      Exercises     Assessment/Plan    PT Assessment Patient needs continued PT services  PT Problem List Decreased strength;Decreased mobility;Decreased safety awareness;Decreased coordination;Decreased knowledge of precautions;Decreased activity tolerance;Decreased cognition;Decreased skin integrity;Decreased balance;Decreased knowledge of use of DME       PT Treatment Interventions DME instruction;Therapeutic activities;Gait training;Therapeutic exercise;Patient/family education;Stair training;Balance training;Functional mobility training;Manual techniques    PT Goals (Current goals can be found in the Care Plan section)  Acute Rehab PT Goals Patient Stated Goal: none stated Time For Goal Achievement: 05/10/20 Potential to Achieve Goals: Fair    Frequency Min 2X/week   Barriers to discharge        Co-evaluation               AM-PAC PT "6 Clicks" Mobility  Outcome Measure Help needed turning from your back to your side while in a flat bed without using bedrails?: A Lot Help needed moving from lying on your back to sitting on the side of a flat bed without using bedrails?: A Lot Help needed moving to and from a bed to a chair (including a wheelchair)?: Total Help needed standing up from a chair using your arms (e.g., wheelchair or bedside chair)?: A Lot Help needed to walk in hospital room?: Total Help needed climbing 3-5 steps with a railing? : Total 6 Click Score: 9    End of Session   Activity Tolerance: Patient tolerated treatment well Patient left: in bed;with  bed alarm set;with nursing/sitter in room Nurse Communication: Mobility status PT Visit Diagnosis: Unsteadiness on feet (R26.81);Difficulty in walking, not elsewhere classified (R26.2);Muscle weakness (generalized) (M62.81)    Time: 2725-3664 PT Time Calculation (min) (ACUTE ONLY): 30 min   Charges:   PT Evaluation $PT Eval Moderate Complexity: 1 Mod PT Treatments $Therapeutic Activity: 23-37 mins        Lavone Nian, PT, DPT 04/26/20, 4:41 PM   Waunita Schooner 04/26/2020, 4:41 PM

## 2020-04-27 DIAGNOSIS — E875 Hyperkalemia: Secondary | ICD-10-CM

## 2020-04-27 LAB — BASIC METABOLIC PANEL
Anion gap: 11 (ref 5–15)
BUN: 60 mg/dL — ABNORMAL HIGH (ref 8–23)
CO2: 17 mmol/L — ABNORMAL LOW (ref 22–32)
Calcium: 8.5 mg/dL — ABNORMAL LOW (ref 8.9–10.3)
Chloride: 116 mmol/L — ABNORMAL HIGH (ref 98–111)
Creatinine, Ser: 2.69 mg/dL — ABNORMAL HIGH (ref 0.44–1.00)
GFR, Estimated: 17 mL/min — ABNORMAL LOW (ref >60)
Glucose, Bld: 109 mg/dL — ABNORMAL HIGH (ref 70–99)
Potassium: 5.9 mmol/L — ABNORMAL HIGH (ref 3.5–5.1)
Sodium: 144 mmol/L (ref 135–145)

## 2020-04-27 LAB — C-REACTIVE PROTEIN: CRP: 0.6 mg/dL (ref <1.0)

## 2020-04-27 LAB — FIBRIN DERIVATIVES D-DIMER (ARMC ONLY): Fibrin derivatives D-dimer (ARMC): 2490.72 ng/mL (FEU) — ABNORMAL HIGH (ref 0.00–499.00)

## 2020-04-27 MED ORDER — SODIUM BICARBONATE 8.4 % IV SOLN
INTRAVENOUS | Status: DC
  Filled 2020-04-27 (×9): qty 850
  Filled 2020-04-27: qty 150

## 2020-04-27 MED ORDER — SODIUM ZIRCONIUM CYCLOSILICATE 10 G PO PACK
10.0000 g | PACK | Freq: Every day | ORAL | Status: DC
  Filled 2020-04-27 (×4): qty 1

## 2020-04-27 MED ORDER — DEXTROSE 50 % IV SOLN
25.0000 mL | Freq: Once | INTRAVENOUS | Status: AC
  Administered 2020-04-27: 10:00:00 25 mL via INTRAVENOUS
  Filled 2020-04-27: qty 50

## 2020-04-27 MED ORDER — PREDNISONE 50 MG PO TABS
50.0000 mg | ORAL_TABLET | Freq: Every day | ORAL | Status: DC
  Filled 2020-04-27: qty 1

## 2020-04-27 MED ORDER — INSULIN ASPART 100 UNIT/ML IV SOLN
10.0000 [IU] | Freq: Once | INTRAVENOUS | Status: AC
  Administered 2020-04-27: 11:00:00 10 [IU] via INTRAVENOUS
  Filled 2020-04-27: qty 0.1

## 2020-04-27 MED ORDER — SODIUM CHLORIDE 0.9 % IV SOLN
100.0000 mg | Freq: Every day | INTRAVENOUS | Status: AC
  Administered 2020-04-27 – 2020-04-28 (×2): 100 mg via INTRAVENOUS
  Filled 2020-04-27 (×2): qty 20

## 2020-04-27 MED ORDER — HEPARIN SODIUM (PORCINE) 5000 UNIT/ML IJ SOLN
5000.0000 [IU] | Freq: Three times a day (TID) | INTRAMUSCULAR | Status: DC
  Administered 2020-04-27 – 2020-04-28 (×4): 5000 [IU] via SUBCUTANEOUS
  Filled 2020-04-27 (×5): qty 1

## 2020-04-27 NOTE — Progress Notes (Signed)
[facility] [facility] [place] NAME: Michelle Carney    MR#:  [id number]  DATE OF BIRTH:  Oct 18, 1929  SUBJECTIVE:  patient baseline has dementia.  Eating BF earlier when feb by RN No resp distress  REVIEW OF SYSTEMS:   Review of Systems  Unable to perform ROS: Dementia   Tolerating Diet:yes Tolerating PT: recommends rehab. Son prefers pt go home with [place]:  No Known Allergies  VITALS:  Blood pressure 127/62, pulse 75, temperature 97.7 F (36.5 C), resp. rate 16, height 5\' 5"  (1.651 m), weight 56 kg, SpO2 94 %.  PHYSICAL EXAMINATION:   Physical Examlimited  GENERAL:  87 y.o.-year-old patient lying in the bed with no acute distress.  LUNGS: Normal breath sounds bilaterally, no wheezing, rales, rhonchi. No use of accessory muscles of respiration.  CARDIOVASCULAR: S1, S2 normal. No murmurs, rubs, or gallops.  ABDOMEN: Soft, nontender, nondistended. Bowel sounds present. No organomegaly or mass.  EXTREMITIES: No cyanosis, clubbing or edema b/l.    NEUROLOGIC: grossly nonfocal. At baseline has dementia PSYCHIATRIC:  patient is alert on VC but sleepy SKIN: No obvious rash, lesion, or ulcer.   LABORATORY PANEL:  CBC Recent Labs  Lab 04/25/20 1638  WBC 5.4  HGB 13.8  HCT 43.7  PLT 263    Chemistries  Recent Labs  Lab 04/25/20 1638 04/27/20 0556  NA 146* 144  K 5.0 5.9*  CL 113* 116*  CO2 19* 17*  GLUCOSE 109* 109*  BUN 50* 60*  CREATININE 2.96* 2.69*  CALCIUM 8.7* 8.5*  AST 38  --   ALT 19  --   ALKPHOS 100  --   BILITOT 0.7  --    Cardiac Enzymes No results for input(s): TROPONINI in the last 168 hours. RADIOLOGY:  DG Chest Port 1 View  Result Date: 04/26/2020 CLINICAL DATA:  COVID-19 EXAM: PORTABLE CHEST 1 VIEW COMPARISON:  None. FINDINGS: The patient is rotated. There is multifocal airspace opacity within both lungs, greatest at the left lung base. No pleural effusion. IMPRESSION: Multifocal airspace  opacities concerning for multifocal pneumonia. Electronically Signed   By: Ulyses Jarred M.D.   On: 04/26/2020 00:25   ASSESSMENT AND PLAN:  [name]  is a 87 y.o. African-American female with a known history of hypertension and stage IV chronic kidney disease, as well as recent COVID-19 infection diagnosed on Tuesday who presented to the emergency room with acute onset of altered mental status with failure to thrive ER Pulse oxymetry was 94% and later 92% on room air and temperature was 98.1 with normal BP, respiratory rate and heart rate   Multifocal pneumonia secondary to COVID-19 with associated encephalopathy. H/o Dementia -on scheduled Mucinex and as needed Tussionex. -  on IV Remdesivir 2/3 and IV steroid therapy with mild elevated inflammatory markers. -- Pro calcitonin less than 0.1, no fever, white count 5.4. Lactic acid 1.2 -- discontinue antibiotics -- CRP 0.6 -sats >92% on ra -CXR mild Pneumonia -pt asymtpomatic  Acute kidney injury superimposed on stage IV chronic kidney disease. Hypernatremia/hypochloremia/hyperkalemia - change to d5W with bicarb - avoid nephrotoxins. -lokelma, insulin and dextrose for elevated K, telemonitoring till K improves  Essential hypertension. - continue amlodipine.   Dementia possibly with behavioral changes. -  continue Aricept and Namenda.   Dyslipidemia. - statin therapy.  GERD. - H2 blocker therapy.   DVT prophylaxis. - Subcutaneous Lovenox.           CODE STATUS: Dr  mansy discussed the CODE STATUS with the patient's son and he clearly indicated that he wants to place her on a DNR/DNI status  Status is: Inpatient  Remains inpatient appropriate because:Altered mental status  Dispo: The patient is from: Home  Anticipated d/c is to: Home with [facility]  Anticipated d/c date is: 1-3days  Patient currently is not medically stable to d/c. treating for COVID  pneumonia Hyperkalemia and renal fialure        TOTAL TIME TAKING CARE OF THIS PATIENT: *25* minutes.  >50% time spent on counselling and coordination of care  Note: This dictation was prepared with Dragon dictation along with smaller phrase technology. Any transcriptional errors that result from this process are unintentional.  [name] M.D    [facility]   CC: Primary care physician; Marguerita Merles, MDPatient ID: Michelle Carney, female   DOB: 07-Oct-1929, 87 y.o.   MRN: [id number]

## 2020-04-28 LAB — BASIC METABOLIC PANEL
Anion gap: 12 (ref 5–15)
BUN: 65 mg/dL — ABNORMAL HIGH (ref 8–23)
CO2: 19 mmol/L — ABNORMAL LOW (ref 22–32)
Calcium: 8.8 mg/dL — ABNORMAL LOW (ref 8.9–10.3)
Chloride: 114 mmol/L — ABNORMAL HIGH (ref 98–111)
Creatinine, Ser: 2.46 mg/dL — ABNORMAL HIGH (ref 0.44–1.00)
GFR, Estimated: 18 mL/min — ABNORMAL LOW (ref >60)
Glucose, Bld: 93 mg/dL (ref 70–99)
Potassium: 5.6 mmol/L — ABNORMAL HIGH (ref 3.5–5.1)
Sodium: 145 mmol/L (ref 135–145)

## 2020-04-28 LAB — FIBRIN DERIVATIVES D-DIMER (ARMC ONLY): Fibrin derivatives D-dimer (ARMC): 2668.67 ng/mL (FEU) — ABNORMAL HIGH (ref 0.00–499.00)

## 2020-04-28 NOTE — Progress Notes (Signed)
Michelle Carney NAME: Michelle Carney    MR#:  [id number]  DATE OF BIRTH:  05-Feb-1930  SUBJECTIVE:  patient baseline has dementia.  No resp distress patient appears more sleepy. This morning RN felt patient was pocketing food. I have kept her NPO. REVIEW OF SYSTEMS:   Review of Systems  Unable to perform ROS: Dementia   Tolerating Diet:yes Tolerating PT: recommends rehab. Son prefers pt go home with [place]:  No Known Allergies  VITALS:  Blood pressure (!) 153/116, pulse 69, temperature 97.9 F (36.6 C), temperature source Oral, resp. rate 18, height 5' 5"  (1.651 m), weight 56 kg, SpO2 100 %.  PHYSICAL EXAMINATION:   Physical Examlimited  GENERAL:  87 y.o.-year-old patient lying in the bed with no acute distress.  LUNGS: Normal breath sounds bilaterally, no wheezing, rales, rhonchi. No use of accessory muscles of respiration.  CARDIOVASCULAR: S1, S2 normal. No murmurs, rubs, or gallops.  ABDOMEN: Soft, nontender, nondistended. Bowel sounds present. No organomegaly or mass.  EXTREMITIES: No cyanosis, clubbing or edema b/l.    NEUROLOGIC: grossly nonfocal. At baseline has dementia PSYCHIATRIC:  patient is alert on VC but sleepy SKIN: No obvious rash, lesion, or ulcer.   LABORATORY PANEL:  CBC Recent Labs  Lab 04/25/20 1638  WBC 5.4  HGB 13.8  HCT 43.7  PLT 263    Chemistries  Recent Labs  Lab 04/25/20 1638 04/27/20 0556 04/28/20 0537  NA 146*   < > 145  K 5.0   < > 5.6*  CL 113*   < > 114*  CO2 19*   < > 19*  GLUCOSE 109*   < > 93  BUN 50*   < > 65*  CREATININE 2.96*   < > 2.46*  CALCIUM 8.7*   < > 8.8*  AST 38  --   --   ALT 19  --   --   ALKPHOS 100  --   --   BILITOT 0.7  --   --    < > = values in this interval not displayed.   Cardiac Enzymes No results for input(s): TROPONINI in the last 168 hours. RADIOLOGY:  No results found. ASSESSMENT AND PLAN:  Michelle Carney  is a 87 y.o.  African-American female with a known history of hypertension and stage IV chronic kidney disease, as well as recent COVID-19 infection diagnosed on Tuesday who presented to the emergency room with acute onset of altered mental status with failure to thrive ER Pulse oxymetry was 94% and later 92% on room air and temperature was 98.1 with normal BP, respiratory rate and heart rate   Multifocal pneumonia secondary to COVID-19 with associated encephalopathy. H/o Dementia -on scheduled Mucinex and as needed Tussionex. -  completed IV Remdesivir 3/3 and IV steroid therapy with mild elevated inflammatory markers. -- Pro calcitonin less than 0.1, no fever, white count 5.4. Lactic acid 1.2 -- discontinue antibiotics -- CRP 0.6 -sats >92% on ra -CXR mild Pneumonia -pt asymtpomatic  Acute kidney injury superimposed on stage IV chronic kidney disease. Hypernatremia/hypochloremia/hyperkalemia Poor po intake - change to d5W with bicarb - avoid nephrotoxins. -lokelma, insulin and dextrose for elevated K, telemonitoring till K improves --K down to 5.6 -creat 2.4 (2.96)  Essential hypertension. - continue amlodipine.   Dementia chronic with worsening since COVID dx per son -  continue Aricept and Namenda. -pt has poor po intake, pocketing food, at risk for aspiration--Palliative care  to see pt -- I spoke with patient son Michelle Carney on the phone at length. He understands patient has been going downhill since diagnosis of COVID. Her dementia has progressed. She has very poor PO intake. He is in agreement with hospice at home at discharge if needed. He has discussed palliative care with patient's primary care physician and met with them last week.   Dyslipidemia. - statin therapy.  GERD. - H2 blocker therapy.   DVT prophylaxis. - Subcutaneous Lovenox.           CODE STATUS: DNR/DNI status  Status is: Inpatient  Remains inpatient appropriate because:Altered mental status  Dispo:  The patient is from: Home  Anticipated d/c is to: Home with most likley hospice  Anticipated d/c date is: 1-3days  Patient currently is not medically stable to d/c. treating for COVID pneumonia Hyperkalemia and renal fialure overall patient is declining with poor PO intake, worsening dementia and renal function.       TOTAL TIME TAKING CARE OF THIS PATIENT: *25* minutes.  >50% time spent on counselling and coordination of care  Note: This dictation was prepared with Dragon dictation along with smaller phrase technology. Any transcriptional errors that result from this process are unintentional.  Michelle Carney M.D    [facility]   CC: Primary care physician; Michelle Carney, MDPatient ID: Michelle Carney, female   DOB: [birth year], 87 y.o.   MRN: [id number]

## 2020-04-28 NOTE — Progress Notes (Signed)
Notified MD I made patient NPO per my nursing judgement, she is likely aspirating and pocketing food.  Unsafe swallowing per my assessment.

## 2020-04-29 DIAGNOSIS — N184 Chronic kidney disease, stage 4 (severe): Secondary | ICD-10-CM

## 2020-04-29 DIAGNOSIS — R627 Adult failure to thrive: Secondary | ICD-10-CM

## 2020-04-29 LAB — FIBRIN DERIVATIVES D-DIMER (ARMC ONLY): Fibrin derivatives D-dimer (ARMC): 2314.86 ng/mL (FEU) — ABNORMAL HIGH (ref 0.00–499.00)

## 2020-04-29 MED ORDER — HALOPERIDOL LACTATE 5 MG/ML IJ SOLN
2.5000 mg | Freq: Once | INTRAMUSCULAR | Status: AC
  Administered 2020-04-29: 2.5 mg via INTRAMUSCULAR
  Filled 2020-04-29: qty 1

## 2020-04-29 NOTE — TOC Progression Note (Signed)
Transition of Care [facility]) - Progression Note    Patient Details  Name: PEGEEN STIGER MRN: [id number] Date of Birth: 31-Mar-1930  [facility]) CM/SW Contact  Shelbie Hutching, RN Phone Number: 04/29/2020, 12:16 PM  Clinical Narrative:    RNCM spoke with [name]. Patient will need EMS transport.  Patient will have to be discharged tomorrow due to EMS not doing any non emergent transports today.     Expected Discharge Plan: Home w Hospice Care Barriers to Discharge: Equipment Delay  Expected Discharge Plan and Services Expected Discharge Plan: [place]   Discharge Planning Services: CM Consult Post Acute Care Choice: Hospice Living arrangements for the past 2 months: Single Family Home Expected Discharge Date: 04/29/20               DME Arranged: Hospital bed,Bedside commode DME Agency: Other - Comment ([place]) Date DME Agency Contacted: 04/29/20 Time DME Agency Contacted: 1119 Representative spoke with at DME Agency: [place]: NA [place] Agency: NA Date [place]: 04/29/20 Time [place]: 1119 Representative spoke with at [place]: [place] hospice at home- [place] ([place]) Interventions    Readmission Risk Interventions No flowsheet data found.

## 2020-04-29 NOTE — Progress Notes (Signed)
PT Cancellation Note  Patient Details Name: Michelle Carney MRN: [id number] DOB: [birth year]   Cancelled Treatment:    Reason Eval/Treat Not Completed: Medical issues which prohibited therapy Pt noted to have critically high K+ (5.6) & exertional activity contraindicated at this time. Will f/u as able & as pt is medically appropriate.  Lavone Nian, PT, DPT 04/29/20, 9:53 AM    Waunita Schooner 04/29/2020, 9:53 AM

## 2020-04-29 NOTE — Discharge Instructions (Signed)
Patient to be followed by hospice at home.

## 2020-04-29 NOTE — TOC Progression Note (Signed)
Transition of Care [facility]) - Progression Note    Patient Details  Name: Michelle Carney MRN: [id number] Date of Birth: [birth year]  Transition of Care [facility]) CM/SW Contact  Shelbie Hutching, RN Phone Number: 04/29/2020, 11:20 AM  Clinical Narrative:    RNCM updated by MD that son would like to take the patient home with hospice.  RNCM attempted to call son, Michelle Carney, message left on voicemail for return call.  Home with hospice referral given to [facility].  1143- received call from [facility], she was able to speak with the patient's son.  Michelle Carney did not want a hospital bed but a wheelchair and bedside commode.  RNCM will discuss transport home with Michelle Carney to determine if EMS is needed.     Expected Discharge Plan: Home w Hospice Care Barriers to Discharge: Equipment Delay  Expected Discharge Plan and Services Expected Discharge Plan: [place]   Discharge Planning Services: CM Consult Post Acute Care Choice: Hospice Living arrangements for the past 2 months: Single Family Home Expected Discharge Date: 04/29/20               DME Arranged: Hospital bed,Bedside commode DME Agency: Other - Comment ([place]) Date DME Agency Contacted: 04/29/20 Time DME Agency Contacted: 1119 Representative spoke with at DME Agency: [place]: NA [place] Agency: NA Date [place]: 04/29/20 Time [place]: 1119 Representative spoke with at [place]: [place] hospice at home- [place] ([place]) Interventions    Readmission Risk Interventions No flowsheet data found.

## 2020-04-29 NOTE — Progress Notes (Signed)
   04/29/20 0325  What Happened  Was fall witnessed? No  Was patient injured? No  Patient found on floor  Found by Staff-comment Earlie Server RN)  Follow Up  MD notified Sharion Settler, NP  Time MD notified [phone]  Additional tests No  Adult Fall Risk Assessment  Risk Factor Category (scoring not indicated) Fall has occurred during this admission (document High fall risk)  Patient Fall Risk Level High fall risk  Adult Fall Risk Interventions  Required Bundle Interventions *See Row Information* High fall risk - low, moderate, and high requirements implemented  Additional Interventions Room near nurses station;Use of appropriate toileting equipment (bedpan, BSC, etc.)  Screening for Fall Injury Risk (To be completed on HIGH fall risk patients) - Assessing Need for Low Bed  Risk For Fall Injury- Low Bed Criteria 85 years or older;Previous fall this admission  Will Implement Low Bed and Floor Mats Yes  NT rounded on pt at 02:45 before she went to lunch to make sure she was dry. Pt found at 03:30. Sitting on floor beside bed. VS WNL.  We tried to have IV put in by IV team so we could give her something for agitation, but they were unsuccessful.  Pt is also NPO.  On call NP has ordered a 1:1 sitter.

## 2020-04-29 NOTE — Progress Notes (Addendum)
[facility]) Hospital Liaison RN note:  Received request from Dr. Posey Pronto and Doran Clay, [facility] for hospice services at home after discharge. Chart and patient information under review by [facility] physician. Hospice eligibility has been confirmed.  Spoke with son, [name] by phone to initiate education related to hospice philosophy, services and to answer any questions. He verbalized understanding of information given. Per discussion, the plan is for discharge by non emergent vehicle when this can be arranged.  DME needs discussed. Son requests a wheelchair and bed side commode for delivery to home. Address has been verified and is correct in the chart. Contact is son, [name] to arrange for equipment delivery.  Please send signed and completed DNR home with patient. Please provide prescriptions at discharge as needed to ensure ongoing symptom management.   [facility] contact number provided to son. Above info shared with TOC.  Please call with any hospice related questions or concerns.  Thank you for the opportunity to participate in this patient's care.  Zandra Abts, RN [facility] Liaison [phone]

## 2020-04-29 NOTE — Discharge Summary (Signed)
[facility] [place] NAME: Michelle Carney    MR#:  [id number]  DATE OF BIRTH:  September 25, 1929  DATE OF ADMISSION:  04/25/2020 ADMITTING PHYSICIAN: Christel Mormon, MD  DATE OF DISCHARGE: 04/29/2020  PRIMARY CARE PHYSICIAN: Marguerita Merles, MD    ADMISSION DIAGNOSIS:  AKI (acute kidney injury) ([place]) [N17.9] Altered mental status, unspecified altered mental status type [R41.82] Acute renal failure superimposed on chronic kidney disease, unspecified CKD stage, unspecified acute renal failure type ([place]) [N17.9, N18.9] COVID-19 virus infection [U07.1]  DISCHARGE DIAGNOSIS:  Acute on CKD-IV/metabolic acidosis Hyperkalemia Progressive worsening Alzheimer's dementia/Failure to thrive COVID-19 Pneumonia  SECONDARY DIAGNOSIS:   Past Medical History:  Diagnosis Date  . Hypertension   . Renal disorder     HOSPITAL COURSE:   [name] a87 y.o.African-American femalewith a known history of hypertension and stage IV chronic kidney disease, as well as recent COVID-19 infection diagnosed on Tuesday who presented to the emergency room with acute onset of altered mental status with failure to thrive ER Pulseoxymetrywas 94% and later 92% on room air and temperature was 98.1 with normal BP, respiratory rate and heart rate   Multifocal pneumonia secondary to COVID-19with associated encephalopathy. -on scheduled Mucinex and as needed Tussionex. - completed IV Remdesivir 3/3 and IV steroid therapy with mild elevated inflammatory markers. -- Pro calcitonin less than 0.1, no fever, white count 5.4. Lactic acid 1.2 -- discontinue antibiotics -- CRP 0.6 -sats >92% on ra -CXR mild Pneumonia -pt asymtpomatic  Acute kidney injury superimposed on stage IV chronic kidney disease. Hypernatremia/hypochloremia/hyperkalemia Poor po intake -changed to d5W with bicarb--pt pulled IV out -avoid nephrotoxins. -lokelma, insulin and dextrose for elevated  K --K down to 5.6 -creat 2.4 (2.96)--likely her new baseline  Relative Hypotension H/o Essential hypertension. -hold amlodipine--BP is soft  Dementia chronic with worsening since COVID dx per son -continue Aricept and Namenda. -pt has poor po intake, pocketing food, at risk for aspiration --1/16-- I spoke with patient son Michelle Carney on the phone at length. He understands patient has been going downhill since diagnosis of COVID. Her dementia has progressed. She has very poor PO intake. He is in agreement with hospice at home at discharge.  He has discussed palliative care with patient's primary care physician and met with them last week. --1/17-- discuss with son Michelle Carney on the phone. Patient had a nontraumatic fall yesterday. Surely she slid out of the bed according to RN. Sitter was in the room. Patient very confused pulled out IV and restless in bed. Overall poor prognosis. Son voiced understanding. He is in agreement with patient coming home and followed by hospice at home. -- TOC to make hospice referral. Will discharged home later today.  Michelle Carney acknowledged and in agreement with plan  GERD. - H2 blocker therapy.  DVT prophylaxis. -Subcutaneous Lovenox.   CODE STATUS:DNR/DNI status  Status is: Inpatient  Remains inpatient appropriate because:Altered mental status  Dispo: The patient is from:Home Anticipated d/c is ME:[facility] with  hospice Anticipated d/c date is: today Patient currently is not making much improvement and medically this is her new baseline  overall patient is declining with poor PO intake, worsening dementia and failure to thrive. Son would like pt to be home with family. Hospice being arranged to follow at home  CONSULTS OBTAINED:    DRUG ALLERGIES:  No Known Allergies  DISCHARGE MEDICATIONS:   Allergies as of 04/29/2020   No Known Allergies     Medication List  STOP taking these medications    amLODipine 5 MG tablet Commonly known as: NORVASC   doxycycline 100 MG capsule Commonly known as: VIBRAMYCIN   lovastatin 20 MG tablet Commonly known as: MEVACOR   Multivitamin Adults 50+ Tabs     TAKE these medications   aspirin 81 MG chewable tablet Chew 81 mg by mouth daily.   donepezil 10 MG tablet Commonly known as: ARICEPT Take 10 mg by mouth daily.   famotidine 20 MG tablet Commonly known as: PEPCID Take 20 mg by mouth daily as needed.   memantine 5 MG tablet Commonly known as: NAMENDA Take 5 mg by mouth daily.            Durable Medical Equipment  (From admission, onward)         Start     Ordered   04/26/20 1528  For home use only DME 3 n 1  Once        04/26/20 1528   04/26/20 1527  For home use only DME Cane  Once       Comments: Quad cane   04/26/20 1528           Discharge Care Instructions  (From admission, onward)         Start     Ordered   04/29/20 0000  Discharge wound care:       Comments: Foam padding   04/29/20 1108          If you experience worsening of your admission symptoms, develop shortness of breath, life threatening emergency, suicidal or homicidal thoughts you must seek medical attention immediately by calling 911 or calling your MD immediately  if symptoms less severe.  You Must read complete instructions/literature along with all the possible adverse reactions/side effects for all the Medicines you take and that have been prescribed to you. Take any new Medicines after you have completely understood and accept all the possible adverse reactions/side effects.   Please note  You were cared for by a hospitalist during your hospital stay. If you have any questions about your discharge medications or the care you received while you were in the hospital after you are discharged, you can call the unit and asked to speak with the hospitalist on call if the hospitalist that took care of you is not available. Once you are  discharged, your primary care physician will handle any further medical issues. Please note that NO REFILLS for any discharge medications will be authorized once you are discharged, as it is imperative that you return to your primary care physician (or establish a relationship with a primary care physician if you do not have one) for your aftercare needs so that they can reassess your need for medications and monitor your lab values. Today   SUBJECTIVE   Confused. Sitter int he room. Restless in bed Does not communicate  VITAL SIGNS:  Blood pressure 113/76, pulse 85, temperature (!) 96.8 F (36 C), temperature source Axillary, resp. rate 18, height 5' 5"  (1.651 m), weight 56 kg, SpO2 94 %.  I/O:  No intake or output data in the 24 hours ending 04/29/20 1109  PHYSICAL EXAMINATION:  GENERAL:  87 y.o.-year-old patient lying in the bed with no acute distress. restless LUNGS: Normal breath sounds bilaterally, no wheezing, rales, rhonchi. No use of accessory muscles of respiration.  CARDIOVASCULAR: S1, S2 normal. No murmurs, rubs, or gallops.  ABDOMEN: Soft, nontender, nondistended. Bowel sounds present. No organomegaly or mass.  EXTREMITIES: No  cyanosis, clubbing or edema b/l.    NEUROLOGIC: grossly nonfocal. At baseline has dementia PSYCHIATRIC:  patient is alert on VC but sleepy   DATA REVIEW:   CBC  Recent Labs  Lab 04/25/20 1638  WBC 5.4  HGB 13.8  HCT 43.7  PLT 263    Chemistries  Recent Labs  Lab 04/25/20 1638 04/27/20 0556 04/28/20 0537  NA 146*   < > 145  K 5.0   < > 5.6*  CL 113*   < > 114*  CO2 19*   < > 19*  GLUCOSE 109*   < > 93  BUN 50*   < > 65*  CREATININE 2.96*   < > 2.46*  CALCIUM 8.7*   < > 8.8*  AST 38  --   --   ALT 19  --   --   ALKPHOS 100  --   --   BILITOT 0.7  --   --    < > = values in this interval not displayed.    Microbiology Results   Recent Results (from the past 240 hour(s))  Resp Panel by RT-PCR (Flu A&B, Covid) Nasopharyngeal  Swab     Status: Abnormal   Collection Time: 04/26/20  1:56 AM   Specimen: Nasopharyngeal Swab; Nasopharyngeal(NP) swabs in vial transport medium  Result Value Ref Range Status   SARS Coronavirus 2 by RT PCR POSITIVE (A) NEGATIVE Final    Comment: RESULT CALLED TO, READ BACK BY AND VERIFIED WITH: Mosetta Pigeon RN [phone] 04/26/20 HNM (NOTE) SARS-CoV-2 target nucleic acids are DETECTED.  The SARS-CoV-2 RNA is generally detectable in upper respiratory specimens during the acute phase of infection. Positive results are indicative of the presence of the identified virus, but do not rule out bacterial infection or co-infection with other pathogens not detected by the test. Clinical correlation with patient history and other diagnostic information is necessary to determine patient infection status. The expected result is Negative.  Fact Sheet for Patients: EntrepreneurPulse.com.au  Fact Sheet for Healthcare Providers: IncredibleEmployment.be  This test is not yet approved or cleared by the [place] FDA and  has been authorized for detection and/or diagnosis of SARS-CoV-2 by FDA under an Emergency Use Authorization (EUA).  This EUA will remain in effect (meaning this test c an be used) for the duration of  the COVID-19 declaration under Section 564(b)(1) of the Act, 21 U.S.C. section 360bbb-3(b)(1), unless the authorization is terminated or revoked sooner.     Influenza A by PCR NEGATIVE NEGATIVE Final   Influenza B by PCR NEGATIVE NEGATIVE Final    Comment: (NOTE) The Xpert Xpress SARS-CoV-2/FLU/RSV plus assay is intended as an aid in the diagnosis of influenza from Nasopharyngeal swab specimens and should not be used as a sole basis for treatment. Nasal washings and aspirates are unacceptable for Xpert Xpress SARS-CoV-2/FLU/RSV testing.  Fact Sheet for Patients: EntrepreneurPulse.com.au  Fact Sheet for Healthcare  Providers: IncredibleEmployment.be  This test is not yet approved or cleared by the [place] FDA and has been authorized for detection and/or diagnosis of SARS-CoV-2 by FDA under an Emergency Use Authorization (EUA). This EUA will remain in effect (meaning this test can be used) for the duration of the COVID-19 declaration under Section 564(b)(1) of the Act, 21 U.S.C. section 360bbb-3(b)(1), unless the authorization is terminated or revoked.  Performed at [facility], [address] [place], [place] [address]     RADIOLOGY:  No results found.   CODE STATUS:  Code Status Orders  (From admission, onward)         Start     Ordered   04/26/20 0231  Do not attempt resuscitation (DNR)  Continuous       Question Answer Comment  In the event of cardiac or respiratory ARREST Do not call a "code blue"   In the event of cardiac or respiratory ARREST Do not perform Intubation, CPR, defibrillation or ACLS   In the event of cardiac or respiratory ARREST Use medication by any route, position, wound care, and other measures to relive pain and suffering. May use oxygen, suction and manual treatment of airway obstruction as needed for comfort.      04/26/20 0230        Code Status History    Date Active Date Inactive Code Status Order ID Comments User Context   04/26/2020 0023 04/26/2020 0230 Full Code [id number]  Sidney Ace Arvella Merles, MD ED   07/02/2016 0431 07/02/2016 2047 Full Code [id number]  Harrie Foreman, MD Inpatient   Advance Care Planning Activity       TOTAL TIME TAKING CARE OF THIS PATIENT: *35* minutes.    [name] M.D  [facility]    CC: Primary care physician; Marguerita Merles, MD

## 2020-04-29 NOTE — Plan of Care (Addendum)
Spoke to pt's son, Halen Mossbarger, to let him know about her fall.  Informed him that she will have a sitter today to keep her safe.   Also got an order for IM Haldol for agitation and this was administered.

## 2020-04-29 NOTE — Plan of Care (Addendum)
PMT note:  Consult for [place] noted. Notes reviewed, goals set, and plans for home with hospice. Hospice liaison following. Please let us know if we can be of assistance with this patient.

## 2020-04-29 NOTE — Evaluation (Signed)
Clinical/Bedside Swallow Evaluation Patient Details  Name: Michelle Carney MRN: [id number] Date of Birth: [birth year]  Today's Date: 04/29/2020 Time: SLP Start Time (ACUTE ONLY): 1115 SLP Stop Time (ACUTE ONLY): 1210 SLP Time Calculation (min) (ACUTE ONLY): 55 min  Past Medical History:  Past Medical History:  Diagnosis Date  . Hypertension   . Renal disorder    Past Surgical History:  Past Surgical History:  Procedure Laterality Date  . ABDOMINAL HYSTERECTOMY    . CHOLECYSTECTOMY     HPI:  Pt is a 87 y.o. female with a stated past medical history of hypertension, CKD, and Dementia who presents for the third time in 1 week after a positive COVID diagnosis 1 week ago with her family versus stating that she has worsening altered mental status and is concerned that she may be dehydrated. Patient is unable to participate in history or review of systems at this time. Family member states that patient was diagnosed with COVID last Tuesday and has had worsening altered mental status over the last couple weeks. Patient's son also states that she has had decreased p.o. intake over this time and is concerned that she is dehydrated given her history of chronic kidney disease. CXR: Multifocal airspace opacities concerning for multifocal pneumonia.   Assessment / Plan / Recommendation Clinical Impression  Pt appears to present w/ oropharyngeal phase dysphagia in light of Significantly declined Cognitive status, Dementia. She was having declined po intake at home prior to admission per chart notes. Pt appears to have attempted po intake but then per chart notes, "patient appears more sleepy. This morning RN felt patient was pocketing food. I have kept her NPO". During evaluation today, pt exhibited Significantly declined oral awareness impacting her engagement and attention to po's being offered. Oral prep and oral phase impacted by her decreased awareness. She exhibited slight-min oral attention to single  ice chips placed anteriorly in her mouth, even a little mastication noted. However, w/ tsp trials of thin and Nectar liquids, pt demonstrated clenched mouth/teeth allowing bolus material to spill from her mouth w/out attempt to accept it orally; no pharyngeal swallows appreciated to any residue. Of note, pharyngeal swallows were noted w/ ice chip trials. Pt was nonverbal during this session; she did not follow any commands. Due to pt's declined Cognitive awareness/State, and the high risk for aspiration, recommend NPO status w/ alternative means for medications. Recommend frequent oral care and single ice chips for oral stimulation, swallowing, and hygiene. ST services will f/u w/ ongoing assessment while admitted in hopes to upgrade to least restrictive diet consistency. MD made aware. SLP Visit Diagnosis: Dysphagia, oropharyngeal phase (R13.12) (Cognitive decline, Dementia)    Aspiration Risk  Risk for inadequate nutrition/hydration;Moderate aspiration risk;Severe aspiration risk    Diet Recommendation  NPO except for Single ice chips w/ NSG supervision; aspiration precautions  Medication Administration: Via alternative means    Other  Recommendations Recommended Consults:  (Dietician f/u; Hospice consult already?) Oral Care Recommendations: Oral care QID;Oral care before and after PO;Staff/trained caregiver to provide oral care Other Recommendations:  (TBD)   Follow up Recommendations  (TBD)      Frequency and Duration min 3x week  2 weeks       Prognosis Prognosis for Safe Diet Advancement: Guarded Barriers to Reach Goals: Cognitive deficits;Language deficits;Time post onset;Severity of deficits      Swallow Study   General Date of Onset: 04/25/20 HPI: Pt is a 87 y.o. female with a stated past medical history of hypertension, CKD,  and Dementia who presents for the third time in 1 week after a positive COVID diagnosis 1 week ago with her family versus stating that she has worsening  altered mental status and is concerned that she may be dehydrated. Patient is unable to participate in history or review of systems at this time. Family member states that patient was diagnosed with COVID last Tuesday and has had worsening altered mental status over the last couple weeks. Patient's son also states that she has had decreased p.o. intake over this time and is concerned that she is dehydrated given her history of chronic kidney disease. CXR: Multifocal airspace opacities concerning for multifocal pneumonia. Type of Study: Bedside Swallow Evaluation Previous Swallow Assessment: none noted Diet Prior to this Study: NPO Temperature Spikes Noted: No (wbc 5.4) Respiratory Status: Room air History of Recent Intubation: No Behavior/Cognition: Confused;Doesn't follow directions Oral Cavity Assessment:  (unable to assess) Oral Care Completed by SLP: No Oral Cavity - Dentition: Adequate natural dentition (anterior noted) Vision:  (n/a) Self-Feeding Abilities: Total assist Patient Positioning: Upright in bed Baseline Vocal Quality:  (nonverbal) Volitional Cough: Cognitively unable to elicit Volitional Swallow: Unable to elicit    Oral/Motor/Sensory Function Overall Oral Motor/Sensory Function:  (CNT)   Ice Chips Ice chips: Impaired Presentation: Spoon (fed; 7 trials) Oral Phase Impairments: Poor awareness of bolus;Reduced lingual movement/coordination Pharyngeal Phase Impairments:  (none)   Thin Liquid Thin Liquid: Impaired Presentation: Spoon Oral Phase Impairments: Poor awareness of bolus    Nectar Thick Nectar Thick Liquid: Impaired Presentation: Spoon Oral Phase Impairments: Poor awareness of bolus   Honey Thick Honey Thick Liquid: Not tested   Puree Puree: Not tested   Solid     Solid: Not tested        Michelle Kenner, MS, CCC-SLP Speech Language Pathologist Rehab Services [phone] Michelle Carney 04/29/2020,3:57 PM

## 2020-04-30 DIAGNOSIS — R627 Adult failure to thrive: Secondary | ICD-10-CM

## 2020-04-30 LAB — FIBRIN DERIVATIVES D-DIMER (ARMC ONLY): Fibrin derivatives D-dimer (ARMC): 1484.26 ng/mL (FEU) — ABNORMAL HIGH (ref 0.00–499.00)

## 2020-04-30 NOTE — Progress Notes (Signed)
PT Cancellation Note  Patient Details Name: [name] MRN: [id number] DOB: [birth year]   Cancelled Treatment:    Reason Eval/Treat Not Completed: Other (comment). Patient being discharged home today with hospice. PT no longer warranted. Will complete PT order and sign off at this time.     Conetta,Kristyn 04/30/2020, 8:53 AM

## 2020-04-30 NOTE — Progress Notes (Signed)
[place] [facility] [place] NAME: Michelle Carney    MR#:  [id number]  DATE OF BIRTH:  [birth year]  SUBJECTIVE:  patient baseline has dementia.  No resp distress patient appears more sleepy. REVIEW OF SYSTEMS:   Review of Systems  Unable to perform ROS: Dementia   Tolerating Diet:yes Tolerating PT: recommends rehab. Son prefers pt go home with [place]:  No Known Allergies  VITALS:  Blood pressure (!) 117/95, pulse 89, temperature 97.7 F (36.5 C), temperature source Axillary, resp. rate 20, height _0  (1.651 m), weight 56 kg, SpO2 95 %.  PHYSICAL EXAMINATION:   Physical Examlimited  GENERAL:  87 y.o.-year-old patient lying in the bed with no acute distress.  LUNGS: Normal breath sounds bilaterally, no wheezing, rales, rhonchi. No use of accessory muscles of respiration.  CARDIOVASCULAR: S1, S2 normal. No murmurs, rubs, or gallops.  ABDOMEN: Soft, nontender, nondistended. Bowel sounds present. No organomegaly or mass.  EXTREMITIES: No cyanosis, clubbing or edema b/l.    NEUROLOGIC: grossly nonfocal. At baseline has dementia PSYCHIATRIC:  patient is alert on VC but sleepy SKIN: No obvious rash, lesion, or ulcer.   LABORATORY PANEL:  CBC Recent Labs  Lab 04/25/20 1638  WBC 5.4  HGB 13.8  HCT 43.7  PLT 263    Chemistries  Recent Labs  Lab 04/25/20 1638 04/27/20 0556 04/28/20 0537  NA 146*   < > 145  K 5.0   < > 5.6*  CL 113*   < > 114*  CO2 19*   < > 19*  GLUCOSE 109*   < > 93  BUN 50*   < > 65*  CREATININE 2.96*   < > 2.46*  CALCIUM 8.7*   < > 8.8*  AST 38  --   --   ALT 19  --   --   ALKPHOS 100  --   --   BILITOT 0.7  --   --    < > = values in this interval not displayed.   Cardiac Enzymes No results for input(s): TROPONINI in the last 168 hours. RADIOLOGY:  No results found. ASSESSMENT AND PLAN:  Michelle Carney  is a 87 y.o. African-American female with a known history of hypertension and stage IV  chronic kidney disease, as well as recent COVID-19 infection diagnosed on Tuesday who presented to the emergency room with acute onset of altered mental status with failure to thrive ER Pulse oxymetry was 94% and later 92% on room air and temperature was 98.1 with normal BP, respiratory rate and heart rate   Michelle Carney a87 y.o.African-American femalewith a known history of hypertension and stage IV chronic kidney disease, as well as recent COVID-19 infection diagnosed on Tuesday who presented to the emergency room with acute onset of altered mental status with failure to thrive ER Pulseoxymetrywas 94% and later 92% on room air and temperature was 98.1 with normal BP, respiratory rate and heart rate   Multifocal pneumonia secondary to COVID-19with associated encephalopathy. -on scheduled Mucinex and as needed Tussionex. -completedIV Remdesivir 3/3 and IVsteroid therapy with mild elevated inflammatory markers. -- Pro calcitonin less than 0.1, no fever, white count 5.4. Lactic acid 1.2 -- discontinue antibiotics -- CRP 0.6 -sats >92% on ra -CXR mild Pneumonia -pt asymtpomatic  Acute kidney injury superimposed on stage IV chronic kidney disease. Hypernatremia/hypochloremia/hyperkalemia Poor po intake -changed to d5W with bicarb--pt pulled IV out -avoid nephrotoxins. -lokelma, insulin and dextrose for elevated K --K  down to 5.6 -creat 2.4 (2.96)--likely her new baseline  Relative Hypotension H/o Essential hypertension. -hold amlodipine--BP is soft  Dementiachronic with worsening since COVID dx per son -continue Aricept and Namenda. -pt has poor po intake, pocketing food, at risk for aspiration --1/16--I spoke with patient son Michelle Carney on the phone at length. He understands patient has been going downhill since diagnosis of COVID. Her dementia has progressed. She has very poor PO intake. He is in agreement with hospice at home at discharge.  He has discussed  palliative care with patient's primary care physician and met with them last week. --1/17-- discuss with son Michelle Carney on the phone. Patient had a nontraumatic fall yesterday. Surely she slid out of the bed according to RN. Sitter was in the room. Patient very confused pulled out IV and restless in bed. Overall poor prognosis. Son voiced understanding. He is in agreement with patient coming home and followed by hospice at home. -- TOC to make hospice referral. Will discharged home later today.  Michelle Carney acknowledged and in agreement with plan  GERD. - H2 blocker therapy.  DVT prophylaxis. -Subcutaneous Lovenox.    CODE STATUS:DNR/DNI status  Status is: Inpatient  Remains inpatient appropriate because:Altered mental status  Dispo: The patient is from:Home Anticipated d/c is AT:[facility] with hospice Anticipated d/c date is: today Patient currently is not making much improvement and medically this is her new baseline     TOTAL TIME TAKING CARE OF THIS PATIENT: *25* minutes.  >50% time spent on counselling and coordination of care  Note: This dictation was prepared with Dragon dictation along with smaller phrase technology. Any transcriptional errors that result from this process are unintentional.  Michelle Carney M.D    [facility]   CC: Primary care physician; Michelle Carney, MDPatient ID: Michelle Carney, female   DOB: September 29, 1929, 87 y.o.   MRN: [id number]

## 2020-04-30 NOTE — TOC Transition Note (Signed)
Transition of Care [facility]) - CM/SW Discharge Note   Patient Details  Name: Michelle Carney MRN: [id number] Date of Birth: Dec 31, 1929  Transition of Care [facility]) CM/SW Contact:  Magnus Ivan, LCSW Phone Number: 04/30/2020, 9:52 AM   Clinical Narrative:   Patient to discharge home today with hospice. Updated patient's son and confirmed home address, that DME has been delivered by hospice, and he is home and ready for patient to DC home. Updated Penny with [facility]. Spoke to [employer] who reported paitent is ready for EMS transport. Printed EMS paperwork to [employer] and asked RN to place in [place] with DNR. Called [facility] and arranged transport. No other needs identified prior to DC.    Final next level of care: Home w Hospice Care Barriers to Discharge: Barriers Resolved   Patient Goals and CMS Choice Patient states their goals for this hospitalization and ongoing recovery are:: home with hospice CMS Medicare.gov Compare Post Acute Care list provided to:: Patient Represenative (must comment) Choice offered to / list presented to : Adult Children  Discharge Placement                Patient to be transferred to facility by: [facility] transporting home with hospice Name of family member notified: Mike Royalty - son Patient and family notified of of transfer: 04/30/20  Discharge Plan and Services   Discharge Planning Services: CM Consult Post Acute Care Choice: Hospice          DME Arranged: Hospital bed,Bedside commode DME Agency: Other - Comment ([place]) Date DME Agency Contacted: 04/29/20 Time DME Agency Contacted: 1119 Representative spoke with at DME Agency: [place]: NA [place] Agency: NA Date [place]: 04/29/20 Time [place]: 1119 Representative spoke with at [place]: [place] hospice at home- [place] ([place]) Interventions     Readmission Risk Interventions No flowsheet data  found.

## 2020-04-30 NOTE — Discharge Summary (Signed)
Michelle Carney at [place] NAME: Michelle Carney    MR#:  [id number]  DATE OF BIRTH:  1930/02/16  DATE OF ADMISSION:  04/25/2020 ADMITTING PHYSICIAN: Christel Mormon, MD  DATE OF DISCHARGE: 04/30/2020  PRIMARY CARE PHYSICIAN: Marguerita Merles, MD    ADMISSION DIAGNOSIS:  AKI (acute kidney injury) ([place]) [N17.9] Altered mental status, unspecified altered mental status type [R41.82] Acute renal failure superimposed on chronic kidney disease, unspecified CKD stage, unspecified acute renal failure type ([place]) [N17.9, N18.9] COVID-19 virus infection [U07.1]  DISCHARGE DIAGNOSIS:  Acute on CKD-IV/metabolic acidosis Hyperkalemia Progressive worsening Alzheimer's dementia/Failure to thrive COVID-19 Pneumonia  SECONDARY DIAGNOSIS:   Past Medical History:  Diagnosis Date  . Hypertension   . Renal disorder     HOSPITAL COURSE:   [name] a87 y.o.African-American femalewith a known history of hypertension and stage IV chronic kidney disease, as well as recent COVID-19 infection diagnosed on Tuesday who presented to the emergency room with acute onset of altered mental status with failure to thrive ER Pulseoxymetrywas 94% and later 92% on room air and temperature was 98.1 with normal BP, respiratory rate and heart rate   Multifocal pneumonia secondary to COVID-19with associated encephalopathy. -on scheduled Mucinex and as needed Tussionex. - completed IV Remdesivir 3/3 and IV steroid therapy with mild elevated inflammatory markers. -- Pro calcitonin less than 0.1, no fever, white count 5.4. Lactic acid 1.2  -- CRP 0.6 -sats >92% on ra -CXR mild Pneumonia -pt asymtpomatic  Acute kidney injury superimposed on stage IV chronic kidney disease. Hypernatremia/hypochloremia/hyperkalemia Poor po intake -changed to d5W with bicarb--pt pulled IV out -avoid nephrotoxins. -lokelma, insulin and dextrose for elevated K --K down to 5.6 -creat  2.4 (2.96)--likely her new baseline  Relative Hypotension H/o Essential hypertension. -hold amlodipine--BP is soft  Dementia chronic with worsening since COVID dx per son -continue Aricept and Namenda. -pt has poor po intake, pocketing food, at risk for aspiration --1/16-- I spoke with patient son Michelle Carney on the phone at length. He understands patient has been going downhill since diagnosis of COVID. Her dementia has progressed. She has very poor PO intake. He is in agreement with hospice at home at discharge.  He has discussed palliative care with patient's primary care physician and met with them last week. --1/17-- discuss with son Michelle Carney on the phone. Patient had a nontraumatic fall yesterday. Surely she slid out of the bed according to RN. Sitter was in the room. Patient very confused pulled out IV and restless in bed. Overall poor prognosis. Son voiced understanding. He is in agreement with patient coming home and followed by hospice at home. -- 1/18--d/c cancelled due to weather and no EMS transport availlable. Pt has been accepted by Hospice. Will d/c today  GERD. - H2 blocker therapy.  DVT prophylaxis. -Subcutaneous Lovenox.   CODE STATUS:DNR/DNI status  Status is: Inpatient  Remains inpatient appropriate because:Altered mental status  Dispo: The patient is from:Home Anticipated d/c is LD:JTTS with  hospice Anticipated d/c date is: today Patient currently is not making much improvement and medically this is her new baseline  overall patient is declining with poor PO intake, worsening dementia and failure to thrive. Son would like pt to be home with family. Hospice arranged to follow at home  CONSULTS OBTAINED:    DRUG ALLERGIES:  No Known Allergies  DISCHARGE MEDICATIONS:   Allergies as of 04/30/2020   No Known Allergies     Medication List    STOP  taking these medications   amLODipine 5 MG tablet Commonly  known as: NORVASC   doxycycline 100 MG capsule Commonly known as: VIBRAMYCIN   lovastatin 20 MG tablet Commonly known as: MEVACOR   Multivitamin Adults 50+ Tabs     TAKE these medications   aspirin 81 MG chewable tablet Chew 81 mg by mouth daily.   donepezil 10 MG tablet Commonly known as: ARICEPT Take 10 mg by mouth daily.   famotidine 20 MG tablet Commonly known as: PEPCID Take 20 mg by mouth daily as needed.   memantine 5 MG tablet Commonly known as: NAMENDA Take 5 mg by mouth daily.            Durable Medical Equipment  (From admission, onward)         Start     Ordered   04/26/20 1528  For home use only DME 3 n 1  Once        04/26/20 1528   04/26/20 1527  For home use only DME Cane  Once       Comments: Quad cane   04/26/20 1528           Discharge Care Instructions  (From admission, onward)         Start     Ordered   04/29/20 0000  Discharge wound care:       Comments: Foam padding   04/29/20 1108          If you experience worsening of your admission symptoms, develop shortness of breath, life threatening emergency, suicidal or homicidal thoughts you must seek medical attention immediately by calling 911 or calling your MD immediately  if symptoms less severe.  You Must read complete instructions/literature along with all the possible adverse reactions/side effects for all the Medicines you take and that have been prescribed to you. Take any new Medicines after you have completely understood and accept all the possible adverse reactions/side effects.   Please note  You were cared for by a hospitalist during your hospital stay. If you have any questions about your discharge medications or the care you received while you were in the hospital after you are discharged, you can call the unit and asked to speak with the hospitalist on call if the hospitalist that took care of you is not available. Once you are discharged, your primary care  physician will handle any further medical issues. Please note that NO REFILLS for any discharge medications will be authorized once you are discharged, as it is imperative that you return to your primary care physician (or establish a relationship with a primary care physician if you do not have one) for your aftercare needs so that they can reassess your need for medications and monitor your lab values. Today   SUBJECTIVE   Confused. Sitter int he room. Restless in bed Does not communicate  VITAL SIGNS:  Blood pressure (!) 117/95, pulse 89, temperature 97.7 F (36.5 C), temperature source Axillary, resp. rate 20, height 5' 5"  (1.651 m), weight 56 kg, SpO2 95 %.  I/O:  No intake or output data in the 24 hours ending 04/30/20 0815  PHYSICAL EXAMINATION:  GENERAL:  87 y.o.-year-old patient lying in the bed with no acute distress. restless LUNGS: Normal breath sounds bilaterally, no wheezing, rales, rhonchi. No use of accessory muscles of respiration.  CARDIOVASCULAR: S1, S2 normal. No murmurs, rubs, or gallops.  ABDOMEN: Soft, nontender, nondistended. Bowel sounds present. No organomegaly or mass.  EXTREMITIES: No cyanosis,  clubbing or edema b/l.    NEUROLOGIC: grossly nonfocal. At baseline has dementia PSYCHIATRIC:  patient is alert on VC but sleepy   DATA REVIEW:   CBC  Recent Labs  Lab 04/25/20 1638  WBC 5.4  HGB 13.8  HCT 43.7  PLT 263    Chemistries  Recent Labs  Lab 04/25/20 1638 04/27/20 0556 04/28/20 0537  NA 146*   < > 145  K 5.0   < > 5.6*  CL 113*   < > 114*  CO2 19*   < > 19*  GLUCOSE 109*   < > 93  BUN 50*   < > 65*  CREATININE 2.96*   < > 2.46*  CALCIUM 8.7*   < > 8.8*  AST 38  --   --   ALT 19  --   --   ALKPHOS 100  --   --   BILITOT 0.7  --   --    < > = values in this interval not displayed.    Microbiology Results   Recent Results (from the past 240 hour(s))  Resp Panel by RT-PCR (Flu A&B, Covid) Nasopharyngeal Swab     Status: Abnormal    Collection Time: 04/26/20  1:56 AM   Specimen: Nasopharyngeal Swab; Nasopharyngeal(NP) swabs in vial transport medium  Result Value Ref Range Status   SARS Coronavirus 2 by RT PCR POSITIVE (A) NEGATIVE Final    Comment: RESULT CALLED TO, READ BACK BY AND VERIFIED WITH: Mosetta Pigeon RN [phone] 04/26/20 HNM (NOTE) SARS-CoV-2 target nucleic acids are DETECTED.  The SARS-CoV-2 RNA is generally detectable in upper respiratory specimens during the acute phase of infection. Positive results are indicative of the presence of the identified virus, but do not rule out bacterial infection or co-infection with other pathogens not detected by the test. Clinical correlation with patient history and other diagnostic information is necessary to determine patient infection status. The expected result is Negative.  Fact Sheet for Patients: EntrepreneurPulse.com.au  Fact Sheet for Healthcare Providers: IncredibleEmployment.be  This test is not yet approved or cleared by the [place] FDA and  has been authorized for detection and/or diagnosis of SARS-CoV-2 by FDA under an Emergency Use Authorization (EUA).  This EUA will remain in effect (meaning this test c an be used) for the duration of  the COVID-19 declaration under Section 564(b)(1) of the Act, 21 U.S.C. section 360bbb-3(b)(1), unless the authorization is terminated or revoked sooner.     Influenza A by PCR NEGATIVE NEGATIVE Final   Influenza B by PCR NEGATIVE NEGATIVE Final    Comment: (NOTE) The Xpert Xpress SARS-CoV-2/FLU/RSV plus assay is intended as an aid in the diagnosis of influenza from Nasopharyngeal swab specimens and should not be used as a sole basis for treatment. Nasal washings and aspirates are unacceptable for Xpert Xpress SARS-CoV-2/FLU/RSV testing.  Fact Sheet for Patients: EntrepreneurPulse.com.au  Fact Sheet for Healthcare  Providers: IncredibleEmployment.be  This test is not yet approved or cleared by the [place] FDA and has been authorized for detection and/or diagnosis of SARS-CoV-2 by FDA under an Emergency Use Authorization (EUA). This EUA will remain in effect (meaning this test can be used) for the duration of the COVID-19 declaration under Section 564(b)(1) of the Act, 21 U.S.C. section 360bbb-3(b)(1), unless the authorization is terminated or revoked.  Performed at [facility], [address], [place], [place] [address]     RADIOLOGY:  No results found.   CODE STATUS:  Code Status Orders  (From admission, onward)         Start     Ordered   04/26/20 0231  Do not attempt resuscitation (DNR)  Continuous       Question Answer Comment  In the event of cardiac or respiratory ARREST Do not call a "code blue"   In the event of cardiac or respiratory ARREST Do not perform Intubation, CPR, defibrillation or ACLS   In the event of cardiac or respiratory ARREST Use medication by any route, position, wound care, and other measures to relive pain and suffering. May use oxygen, suction and manual treatment of airway obstruction as needed for comfort.      04/26/20 0230        Code Status History    Date Active Date Inactive Code Status Order ID Comments User Context   04/26/2020 0023 04/26/2020 0230 Full Code [id number]  Sidney Ace Arvella Merles, MD ED   07/02/2016 0431 07/02/2016 2047 Full Code [id number]  Harrie Foreman, MD Inpatient   Advance Care Planning Activity       TOTAL TIME TAKING CARE OF THIS PATIENT: *35* minutes.    [name] M.D  [facility]    CC: Primary care physician; Marguerita Merles, MD

## 2020-06-11 DEATH — deceased
